# Patient Record
Sex: Female | Born: 1985 | Race: White | Hispanic: No | Smoking: Current every day smoker
Health system: Southern US, Community
[De-identification: ages and names within clinical notes are randomized; demographics above are authoritative.]

## PROBLEM LIST (undated history)

## (undated) DIAGNOSIS — D696 Thrombocytopenia, unspecified: Secondary | ICD-10-CM

## (undated) DIAGNOSIS — M549 Dorsalgia, unspecified: Secondary | ICD-10-CM

## (undated) DIAGNOSIS — M5126 Other intervertebral disc displacement, lumbar region: Secondary | ICD-10-CM

## (undated) HISTORY — DX: Thrombocytopenia, unspecified: D69.6

## (undated) HISTORY — DX: Other intervertebral disc displacement, lumbar region: M51.26

## (undated) HISTORY — PX: ANTERIOR CRUCIATE LIGAMENT REPAIR: SHX115

## (undated) HISTORY — PX: ABDOMINAL HYSTERECTOMY: SHX81

---
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
## 2005-03-07 ENCOUNTER — Emergency Department: Payer: Self-pay | Admitting: Emergency Medicine

## 2005-05-13 ENCOUNTER — Emergency Department: Payer: Self-pay | Admitting: Emergency Medicine

## 2005-05-14 ENCOUNTER — Emergency Department: Payer: Self-pay | Admitting: Internal Medicine

## 2005-05-22 ENCOUNTER — Emergency Department: Payer: Self-pay | Admitting: Emergency Medicine

## 2005-05-27 ENCOUNTER — Inpatient Hospital Stay: Payer: Self-pay | Admitting: Obstetrics and Gynecology

## 2005-09-30 ENCOUNTER — Emergency Department: Payer: Self-pay | Admitting: Emergency Medicine

## 2005-10-08 ENCOUNTER — Emergency Department: Payer: Self-pay | Admitting: General Practice

## 2005-10-09 ENCOUNTER — Inpatient Hospital Stay: Payer: Self-pay | Admitting: Unknown Physician Specialty

## 2005-12-02 ENCOUNTER — Emergency Department: Payer: Self-pay | Admitting: Emergency Medicine

## 2005-12-10 ENCOUNTER — Inpatient Hospital Stay: Payer: Self-pay | Admitting: Unknown Physician Specialty

## 2006-03-20 ENCOUNTER — Ambulatory Visit: Payer: Self-pay

## 2006-09-27 ENCOUNTER — Other Ambulatory Visit: Payer: Self-pay

## 2006-09-27 ENCOUNTER — Ambulatory Visit: Payer: Self-pay | Admitting: Emergency Medicine

## 2006-10-19 ENCOUNTER — Other Ambulatory Visit: Payer: Self-pay

## 2006-10-19 ENCOUNTER — Ambulatory Visit: Payer: Self-pay | Admitting: Emergency Medicine

## 2006-10-20 ENCOUNTER — Other Ambulatory Visit: Payer: Self-pay

## 2006-10-20 ENCOUNTER — Inpatient Hospital Stay: Payer: Self-pay | Admitting: Psychiatry

## 2006-11-02 ENCOUNTER — Other Ambulatory Visit: Payer: Self-pay

## 2006-11-02 ENCOUNTER — Emergency Department: Payer: Self-pay | Admitting: Unknown Physician Specialty

## 2006-12-31 ENCOUNTER — Ambulatory Visit: Payer: Self-pay | Admitting: Internal Medicine

## 2007-01-30 IMAGING — CR DG ANKLE 2V *L*
1 series · 2 of 2 positions shown · non-contrast
Comparison: none

REASON FOR EXAM: Pain, injury
COMMENTS:

[Series 1: view not recorded · 0.17mm/px · 2 of 2 slices shown]
[im 1/2]
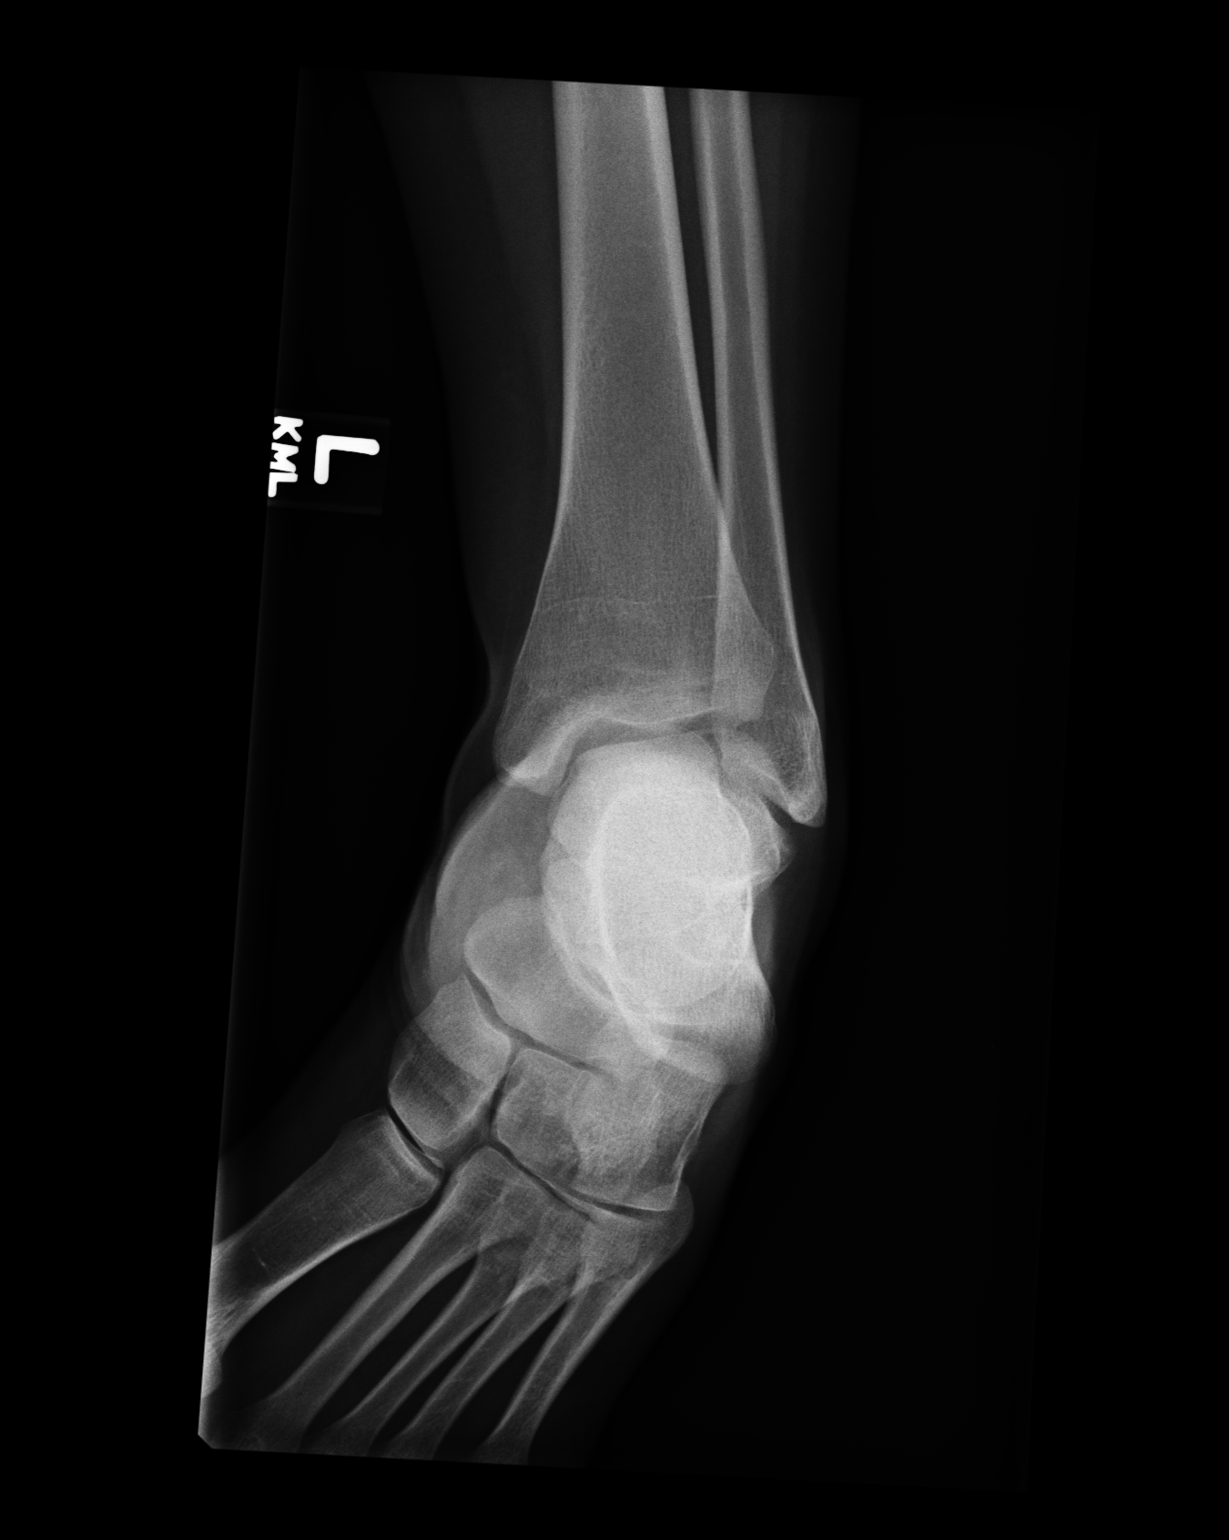
[im 2/2]
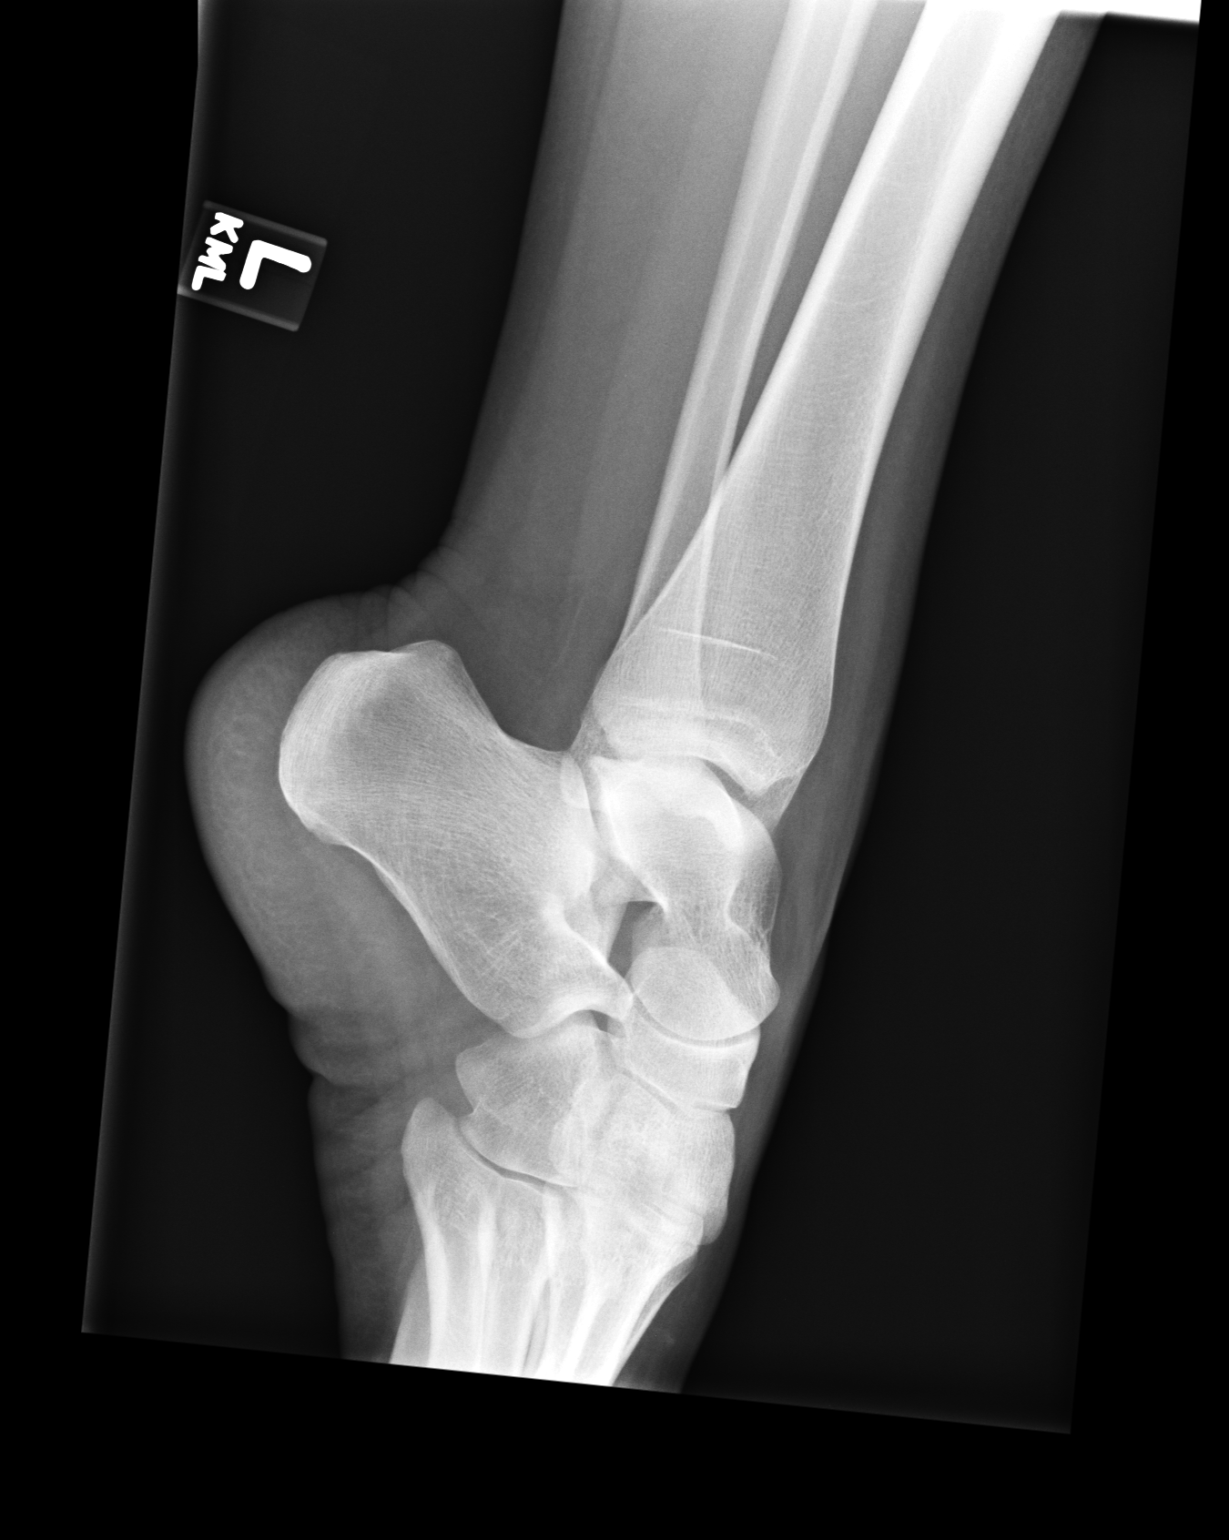

[2 of 2 positions shown; findings below may reference images not displayed]

PROCEDURE:     DXR - DXR ANKLE LEFT AP AND LATERAL  - December 02, 2004  [DATE]

RESULT:     The ankle is inverted and the toes turned in. The etiology for
this is to me uncertain. No internal derangement is identified to account
for this. Repeat CT with the cast off might be helpful for further
evaluation, if such is clinically indicated. No fracture about the ankle is
identified.
IMPRESSION: Please see above.

## 2007-01-30 IMAGING — CR DG FOOT 2V*L*
1 series · 2 of 2 positions shown · non-contrast
Comparison: none

REASON FOR EXAM: pain in foot
COMMENTS:

PROCEDURE:     DXR - DXR FOOT LEFT AP AND LATERAL  - December 02, 2004  [DATE]
RESULT:     Two views of the LEFT foot show no fracture, dislocation or
other acute bony abnormality.

[Series 1: view not recorded · 0.17mm/px · 2 of 2 slices shown]
[im 1/2]
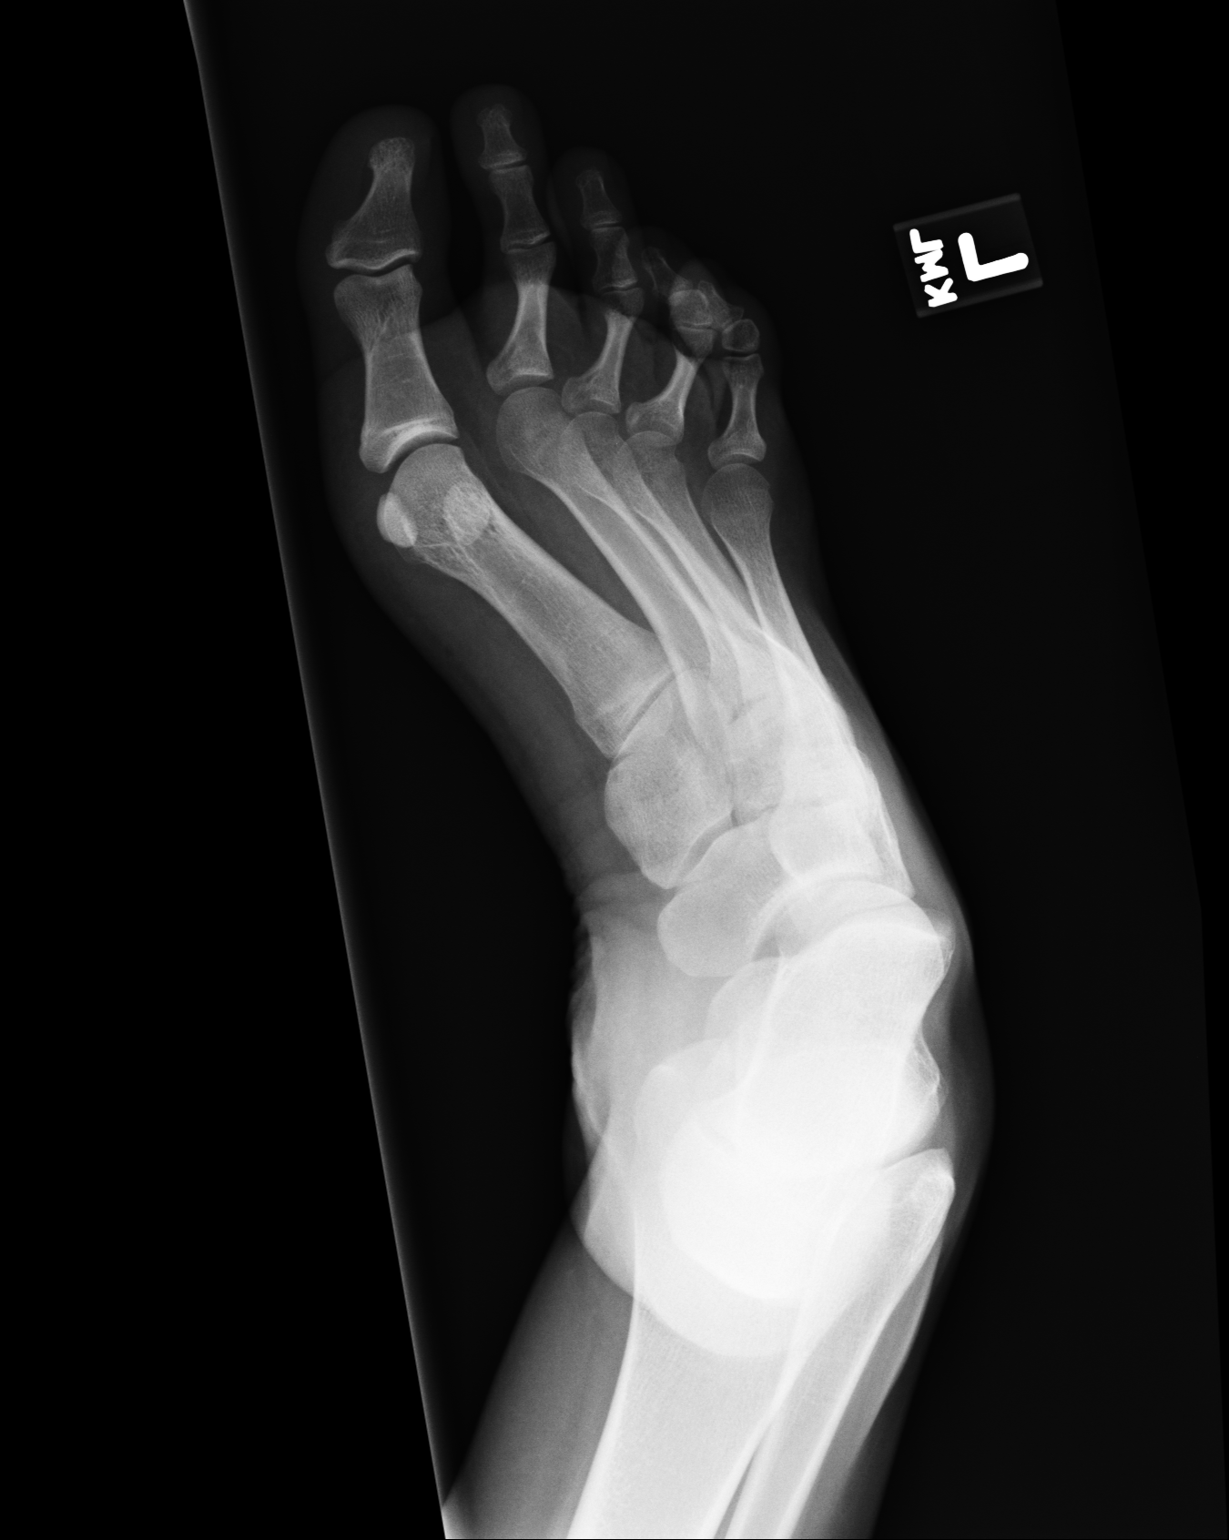
[im 2/2]
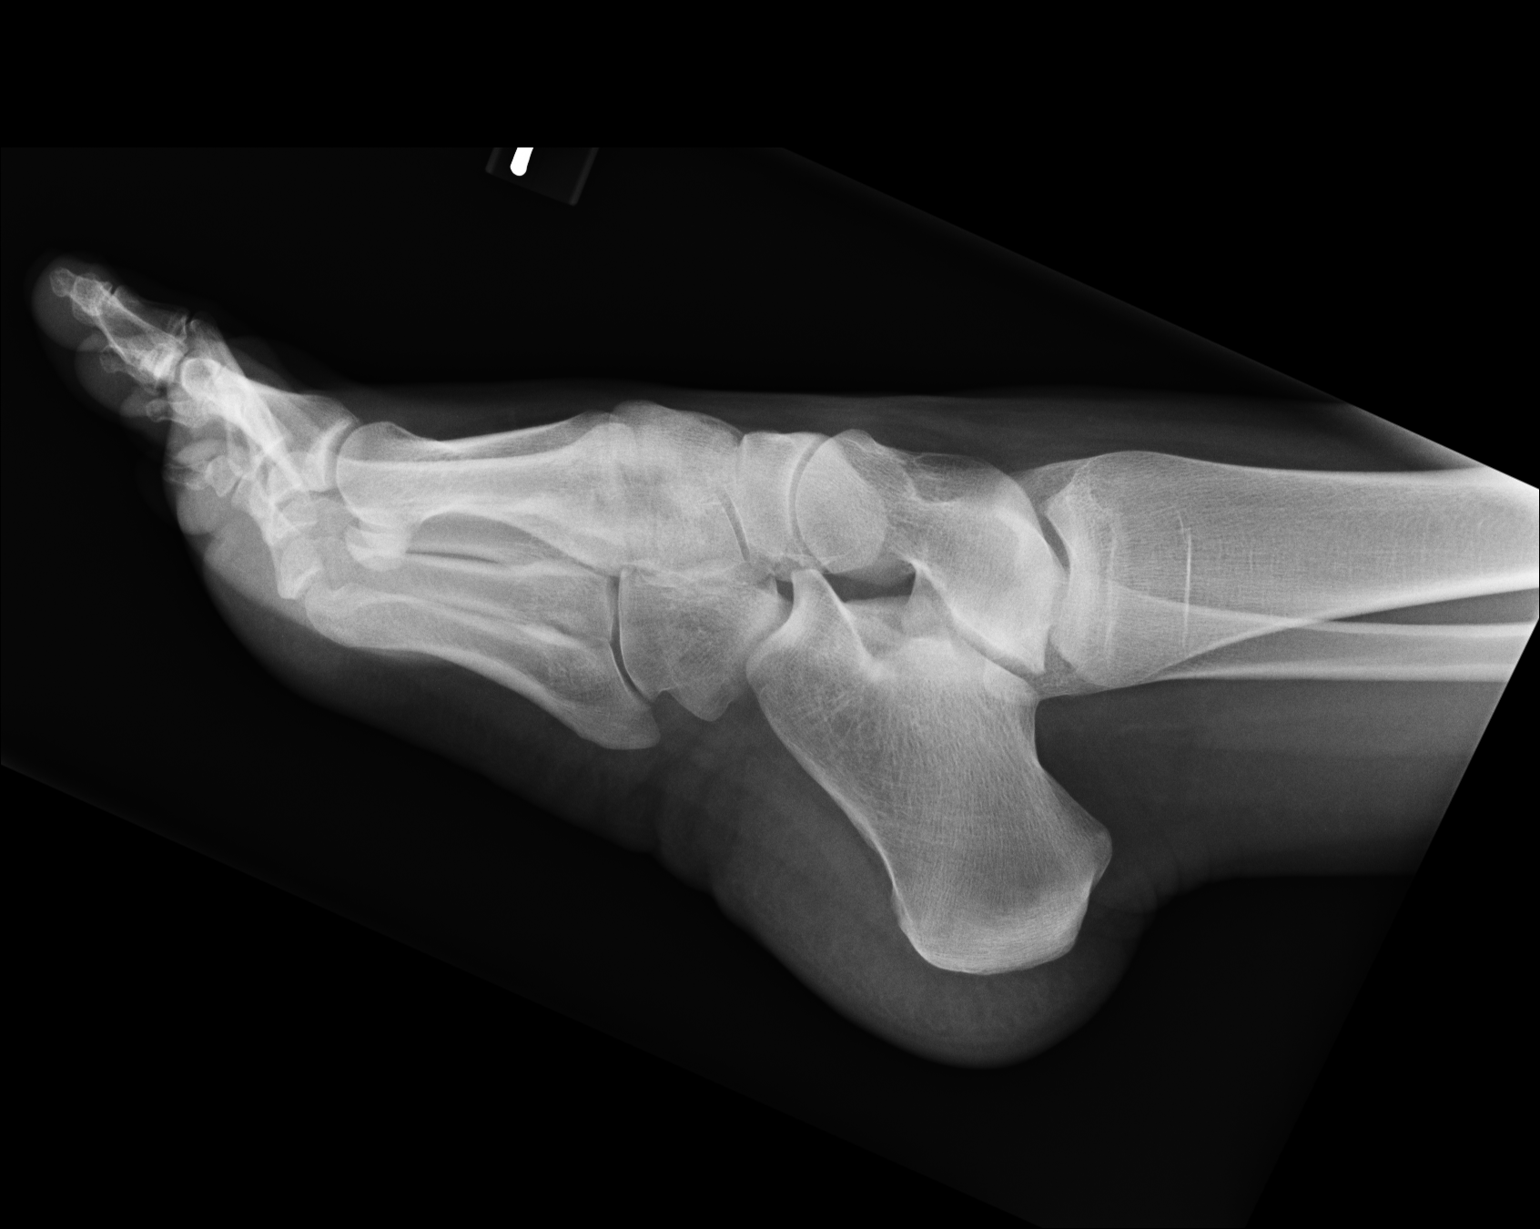

[2 of 2 positions shown; findings below may reference images not displayed]

IMPRESSION: 1)No acute bony abnormalities are identified.

## 2007-02-26 IMAGING — US US OB < 14 WEEKS - US OB TV
1 series · 17 of 28 positions shown · non-contrast
Comparison: none

REASON FOR EXAM: Pregnant w/vaginal bleeding
COMMENTS:

[Series 1: us ob < 14 weeks - us ob tv · 17 of 53 slices shown]
[im 1/53]
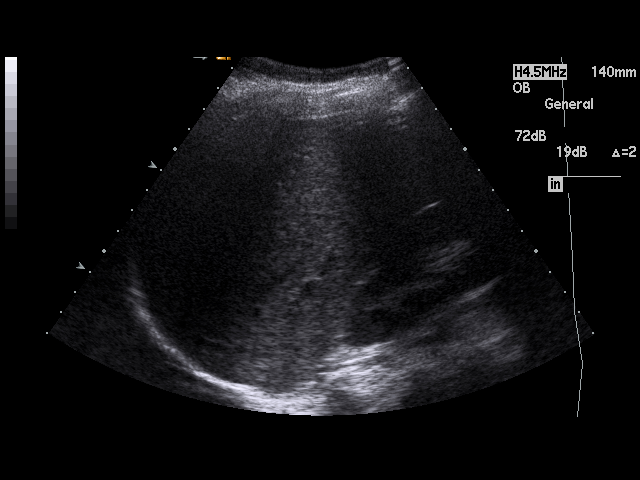
[im 4/53]
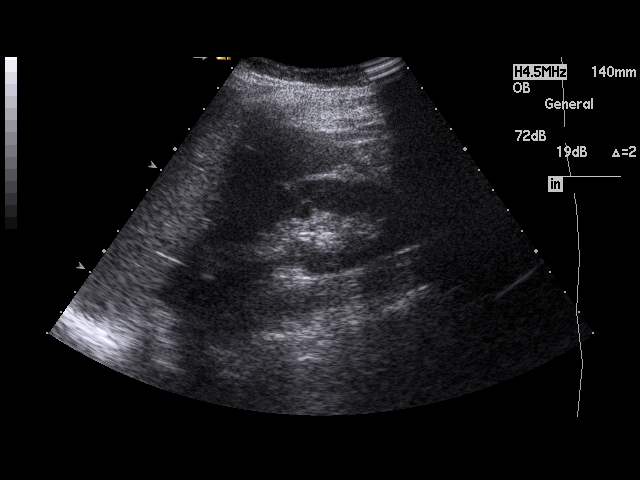
[im 8/53]
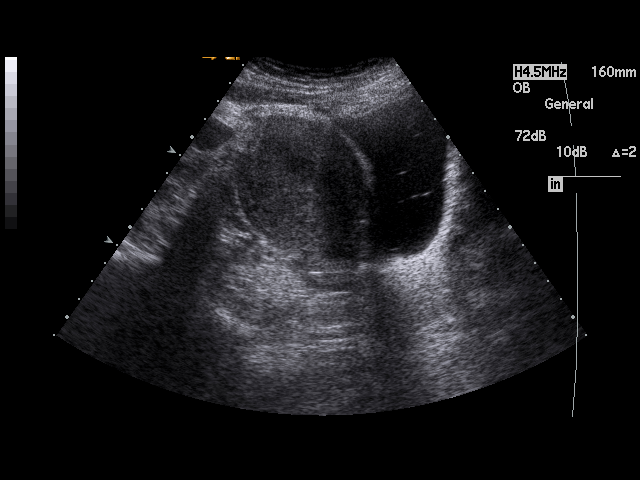
[im 10/53]
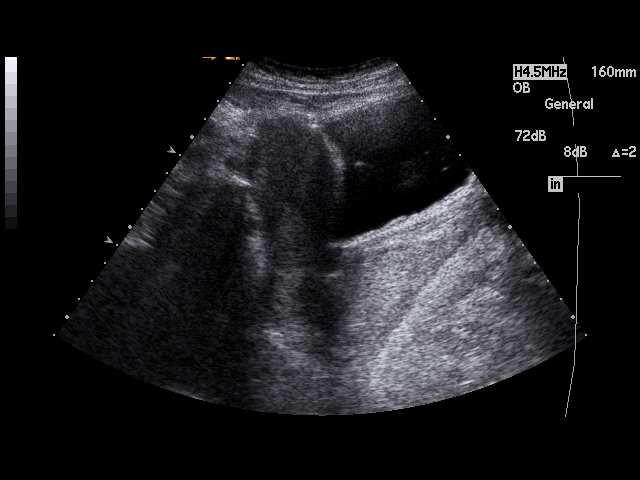
[im 14/53]
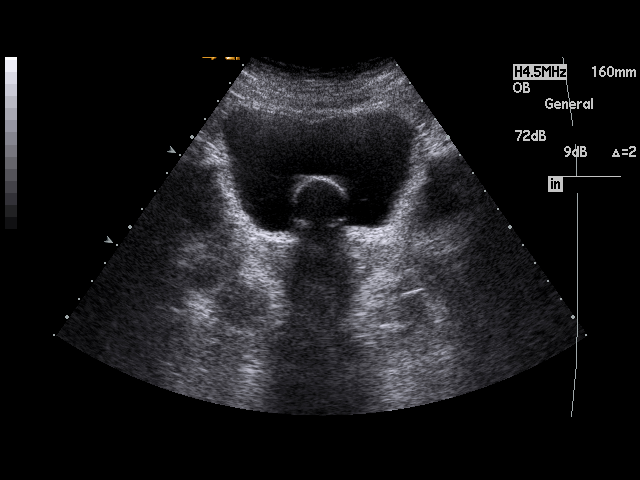
[im 18/53]
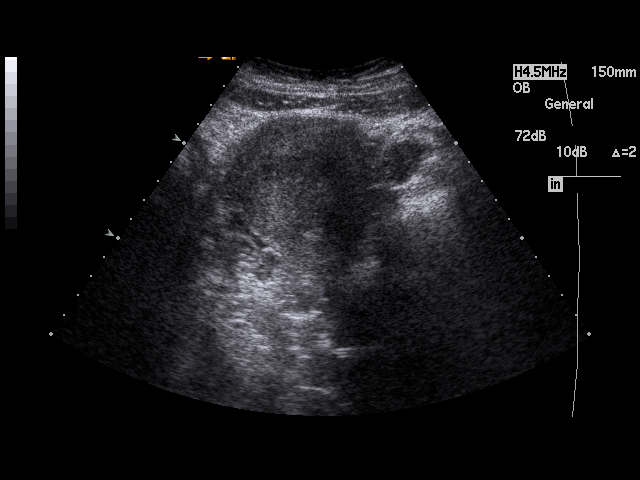
[im 20/53]
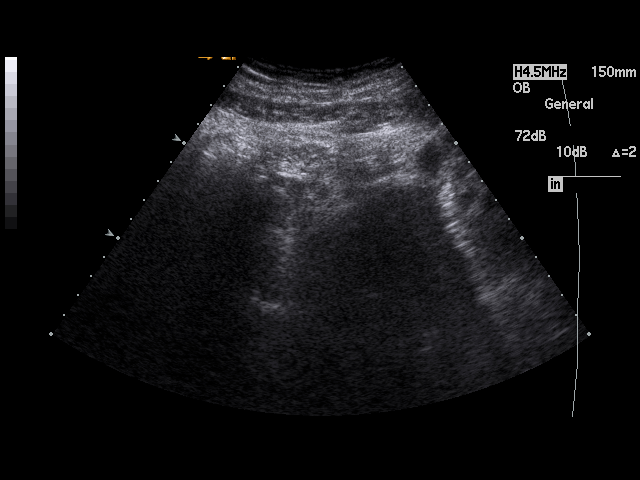
[im 24/53]
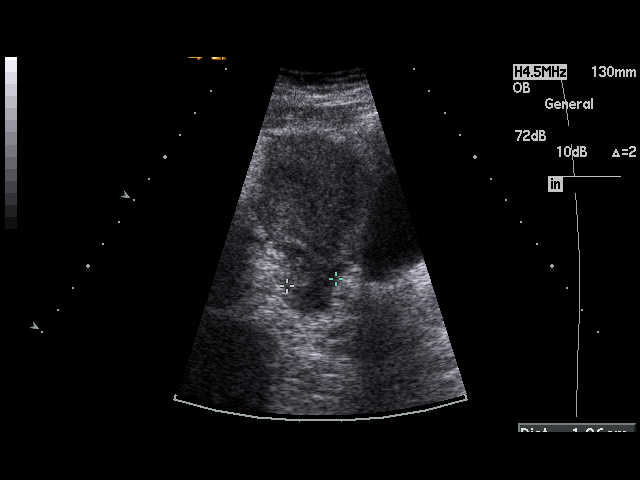
[im 27/53]
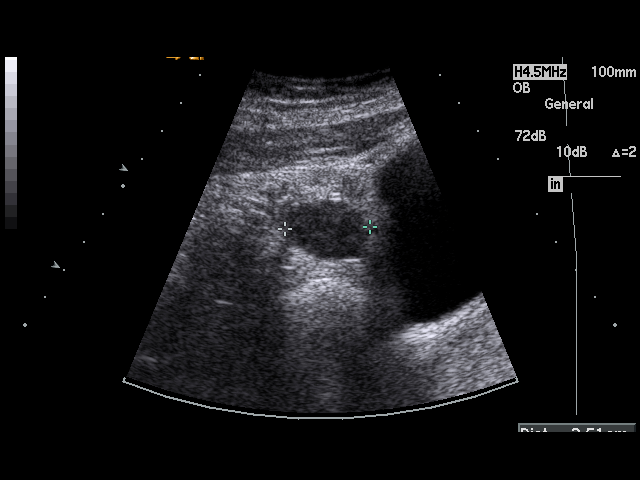
[im 29/53]
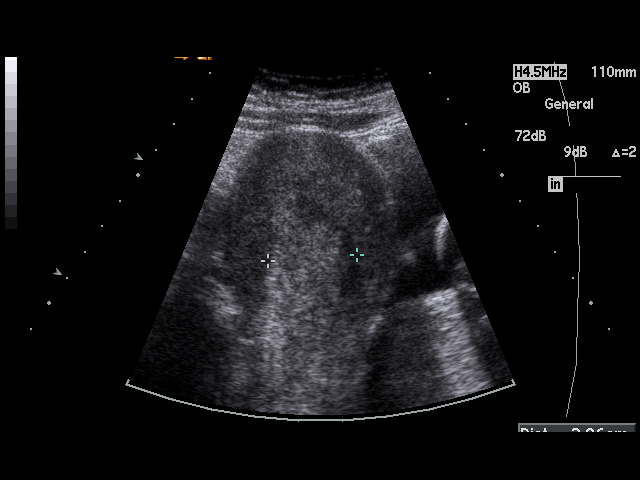
[im 33/53]
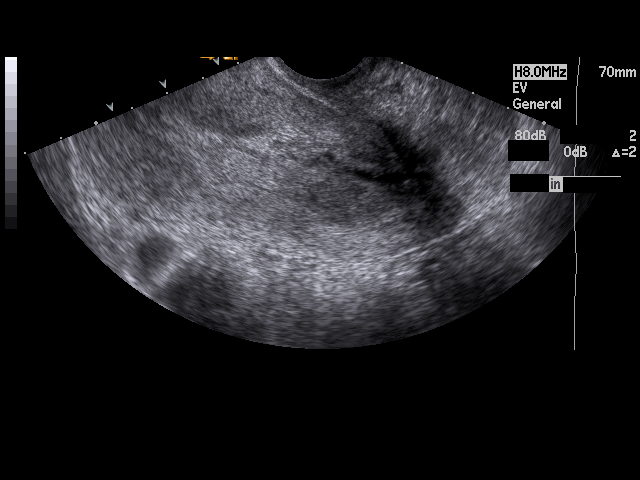
[im 35/53]
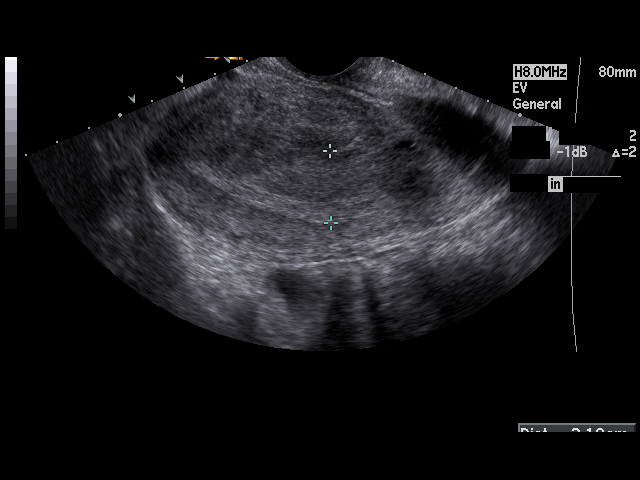
[im 39/53]
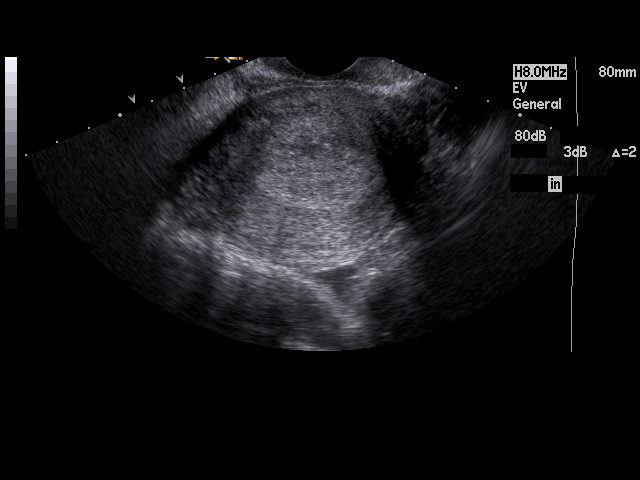
[im 43/53]
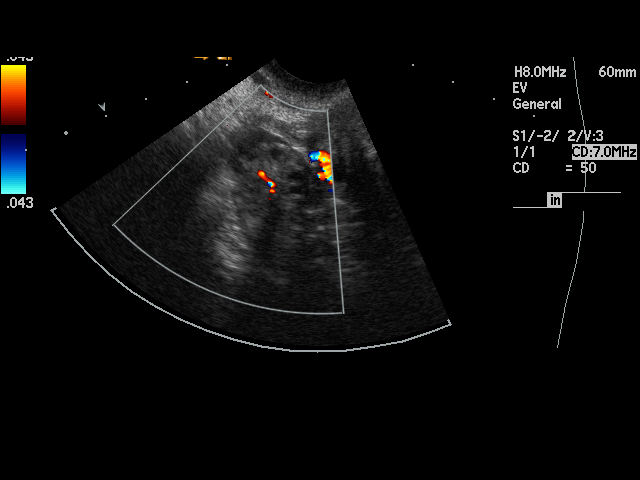
[im 45/53]
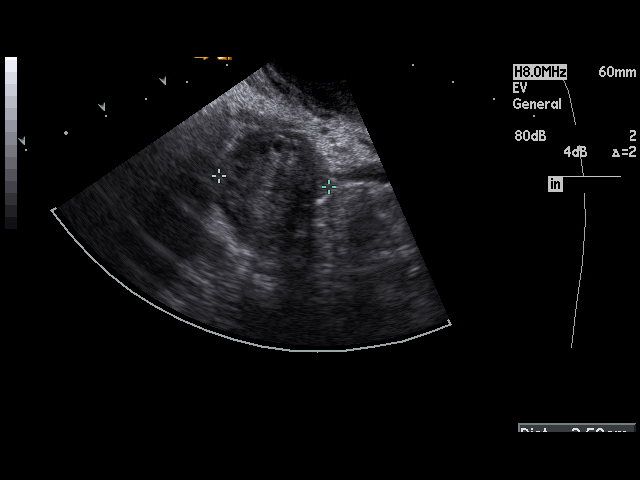
[im 49/53]
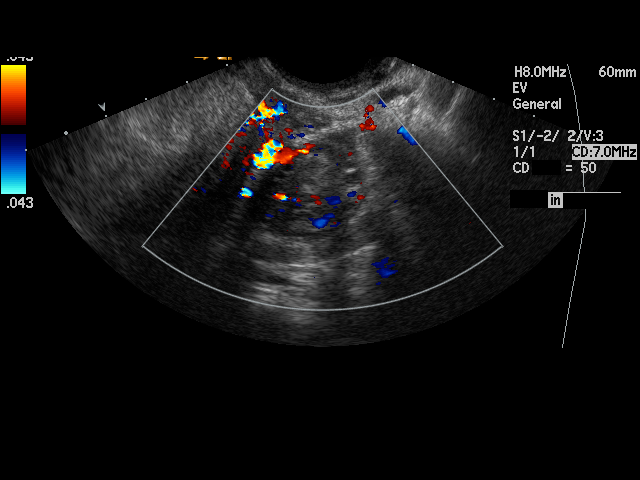
[im 53/53]
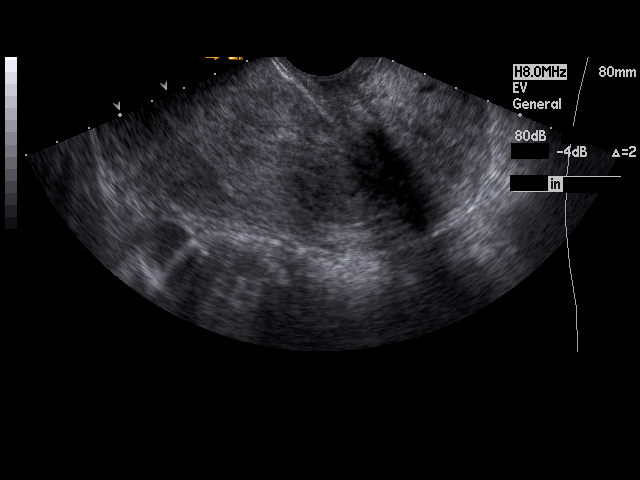

[17 of 28 positions shown; findings below may reference images not displayed]

PROCEDURE:     US  - US OB LESS THAN 14 WEEKS  - December 29, 2004  [DATE]

RESULT:     Real-time imaging was obtained.  The uterus is slightly
anteflexed.  There is thickening of the endometrial lining which may be
secondary to blood from the recent bleeding.  No intrauterine gestational
sac is identified.  No definite adnexal mass is seen, and no fluid is noted
in the cul-de-sac region.  No hydronephrosis is identified.  The ovaries
appear within normal limits.
IMPRESSION: No intrauterine gestational sac.  The findings may be
compatible with a missed abortion.  I cannot totally exclude an ectopic, and
this can be correlated clinically with followup beta HCGs and possibly a
followup ultrasound.

## 2007-03-25 ENCOUNTER — Emergency Department: Payer: Self-pay | Admitting: Emergency Medicine

## 2007-03-25 ENCOUNTER — Other Ambulatory Visit: Payer: Self-pay

## 2007-04-28 ENCOUNTER — Other Ambulatory Visit: Payer: Self-pay

## 2007-04-28 ENCOUNTER — Emergency Department: Payer: Self-pay | Admitting: Emergency Medicine

## 2007-06-14 ENCOUNTER — Emergency Department: Payer: Self-pay | Admitting: Emergency Medicine

## 2007-06-14 ENCOUNTER — Other Ambulatory Visit: Payer: Self-pay

## 2007-07-22 ENCOUNTER — Emergency Department: Payer: Self-pay | Admitting: Emergency Medicine

## 2007-08-23 ENCOUNTER — Emergency Department: Payer: Self-pay | Admitting: Emergency Medicine

## 2008-05-29 ENCOUNTER — Emergency Department: Payer: Self-pay | Admitting: Emergency Medicine

## 2008-06-04 ENCOUNTER — Emergency Department: Payer: Self-pay

## 2008-10-27 ENCOUNTER — Emergency Department: Payer: Self-pay | Admitting: Emergency Medicine

## 2008-11-24 ENCOUNTER — Ambulatory Visit: Payer: Self-pay | Admitting: Internal Medicine

## 2008-11-24 IMAGING — CR DG CHEST 2V
1 series · 2 of 2 positions shown · non-contrast
Comparison: none

REASON FOR EXAM: CP - RM 9
COMMENTS:

PROCEDURE:     MDR - MDR CHEST PA(OR AP) AND LATERAL  - September 27, 2006  [DATE]
RESULT:     The lungs are adequately inflated. There is no focal infiltrate.
The heart and pulmonary vascularity are within the limits of normal. There
is no evidence of a pleural effusion.

[Series 1: view not recorded · 0.17mm/px · 2 of 2 slices shown]
[im 1/2]
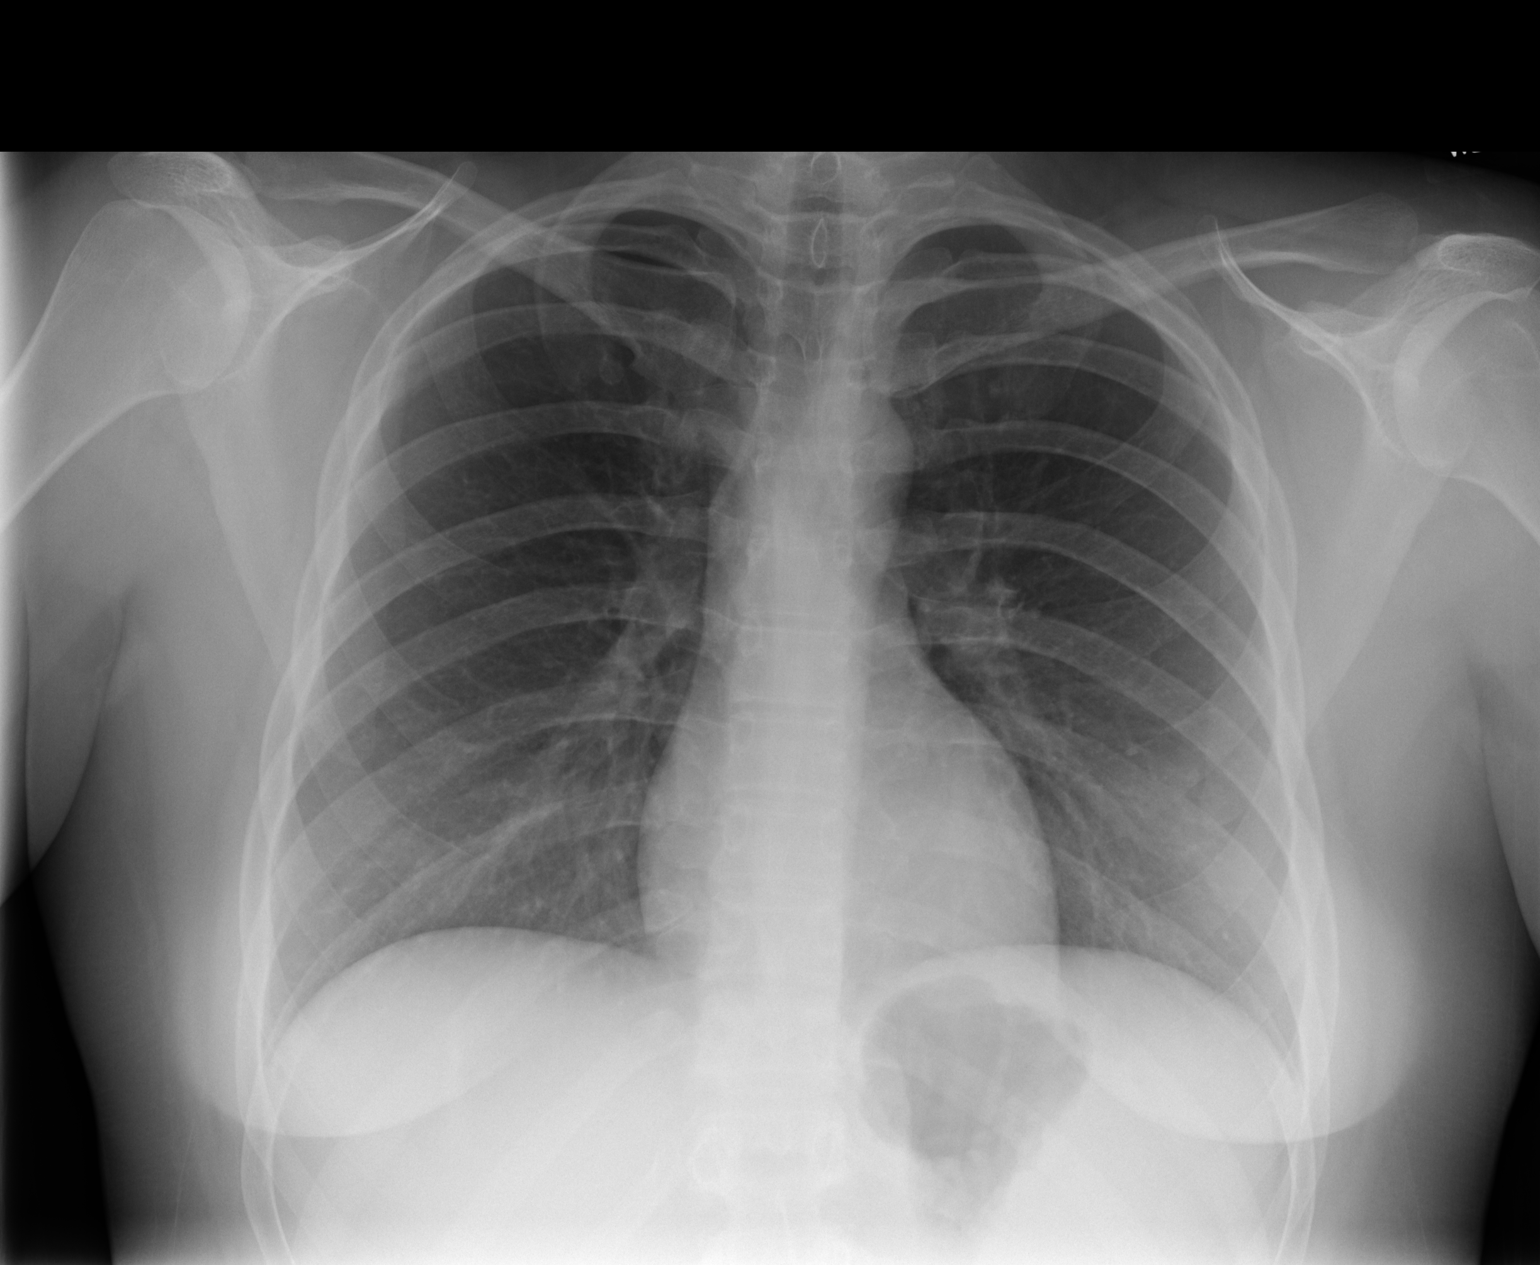
[im 2/2]
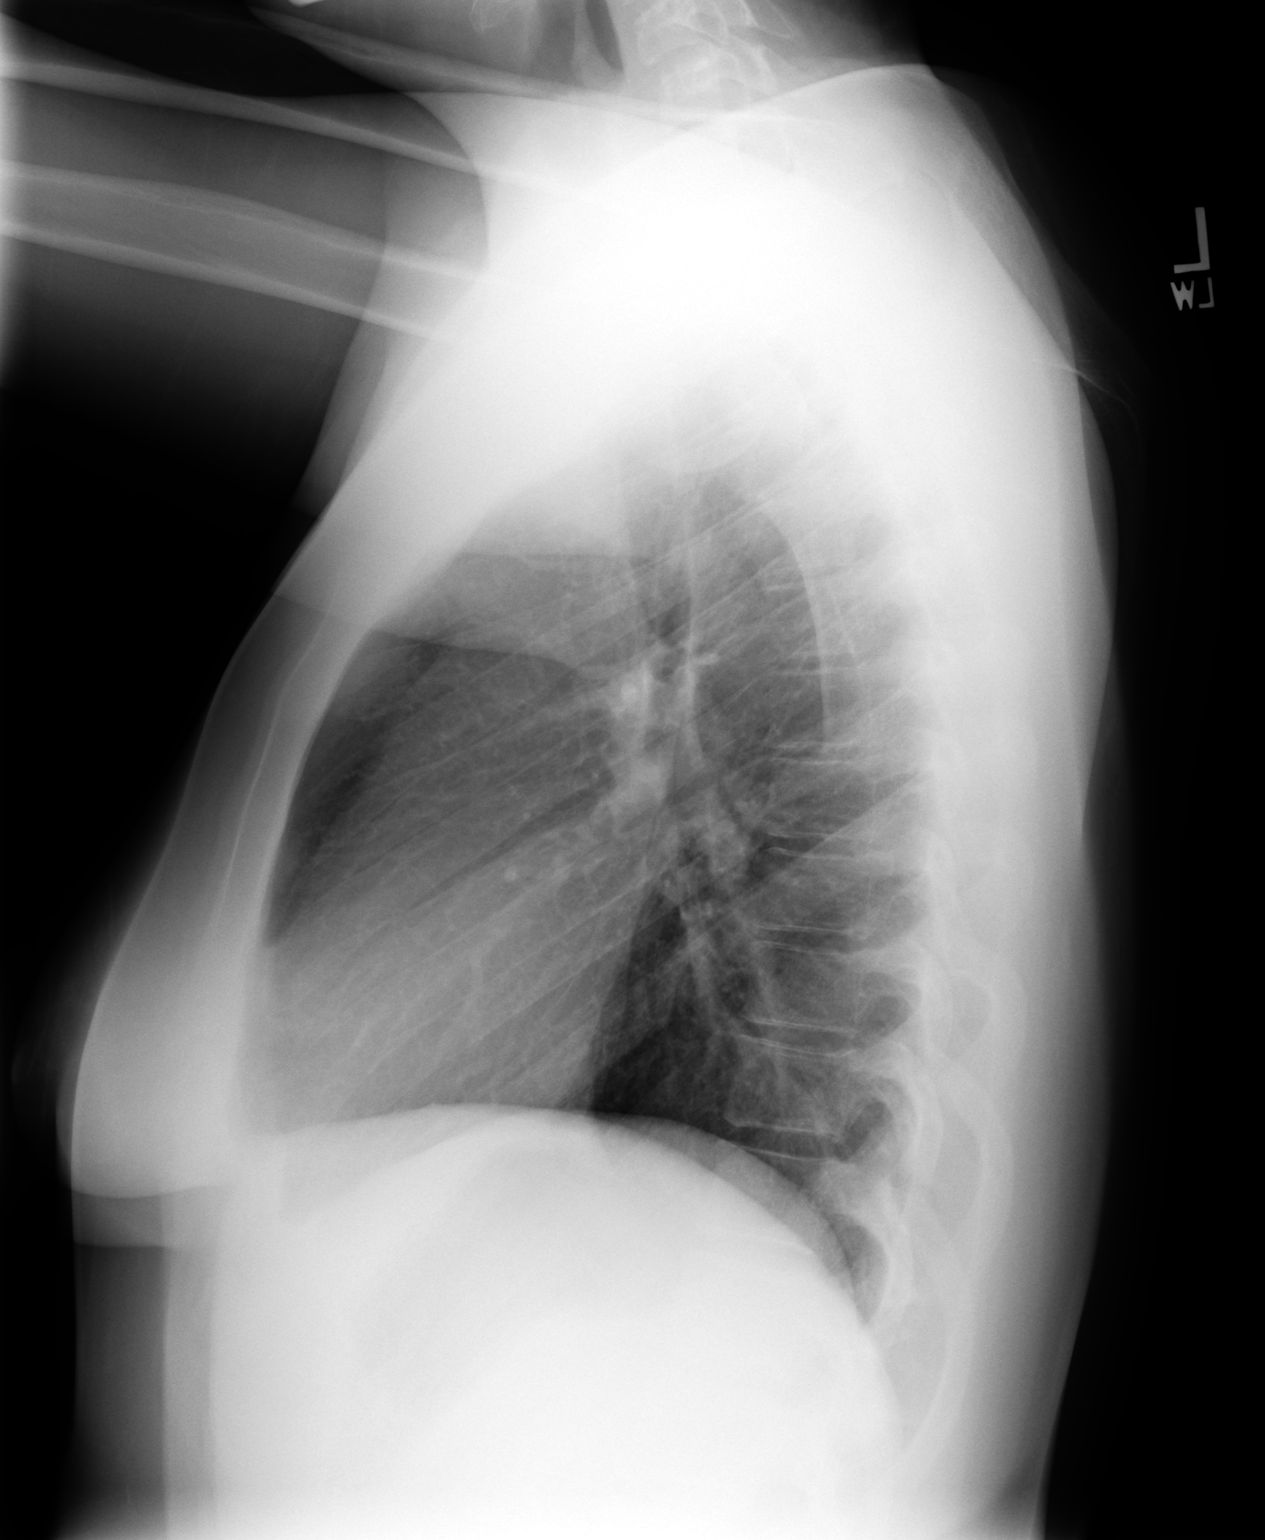

[2 of 2 positions shown; findings below may reference images not displayed]

IMPRESSION: 1.     I do not see evidence of acute cardiopulmonary abnormality.

## 2008-11-26 ENCOUNTER — Emergency Department: Payer: Self-pay | Admitting: Emergency Medicine

## 2008-12-16 IMAGING — CR DG CHEST 2V
1 series · 2 of 2 positions shown · non-contrast
Comparison: none

REASON FOR EXAM: Shortness of breath, chest pain
COMMENTS:

PROCEDURE:     MDR - MDR CHEST PA(OR AP) AND LATERAL  - October 19, 2006  [DATE]
RESULT:     Comparison is made to a prior study dated 09/27/2006.
The lungs are clear. The cardiac silhouette and visualized bony skeleton are
unremarkable.

[Series 1: view not recorded · 0.17mm/px · 2 of 2 slices shown]
[im 1/2]
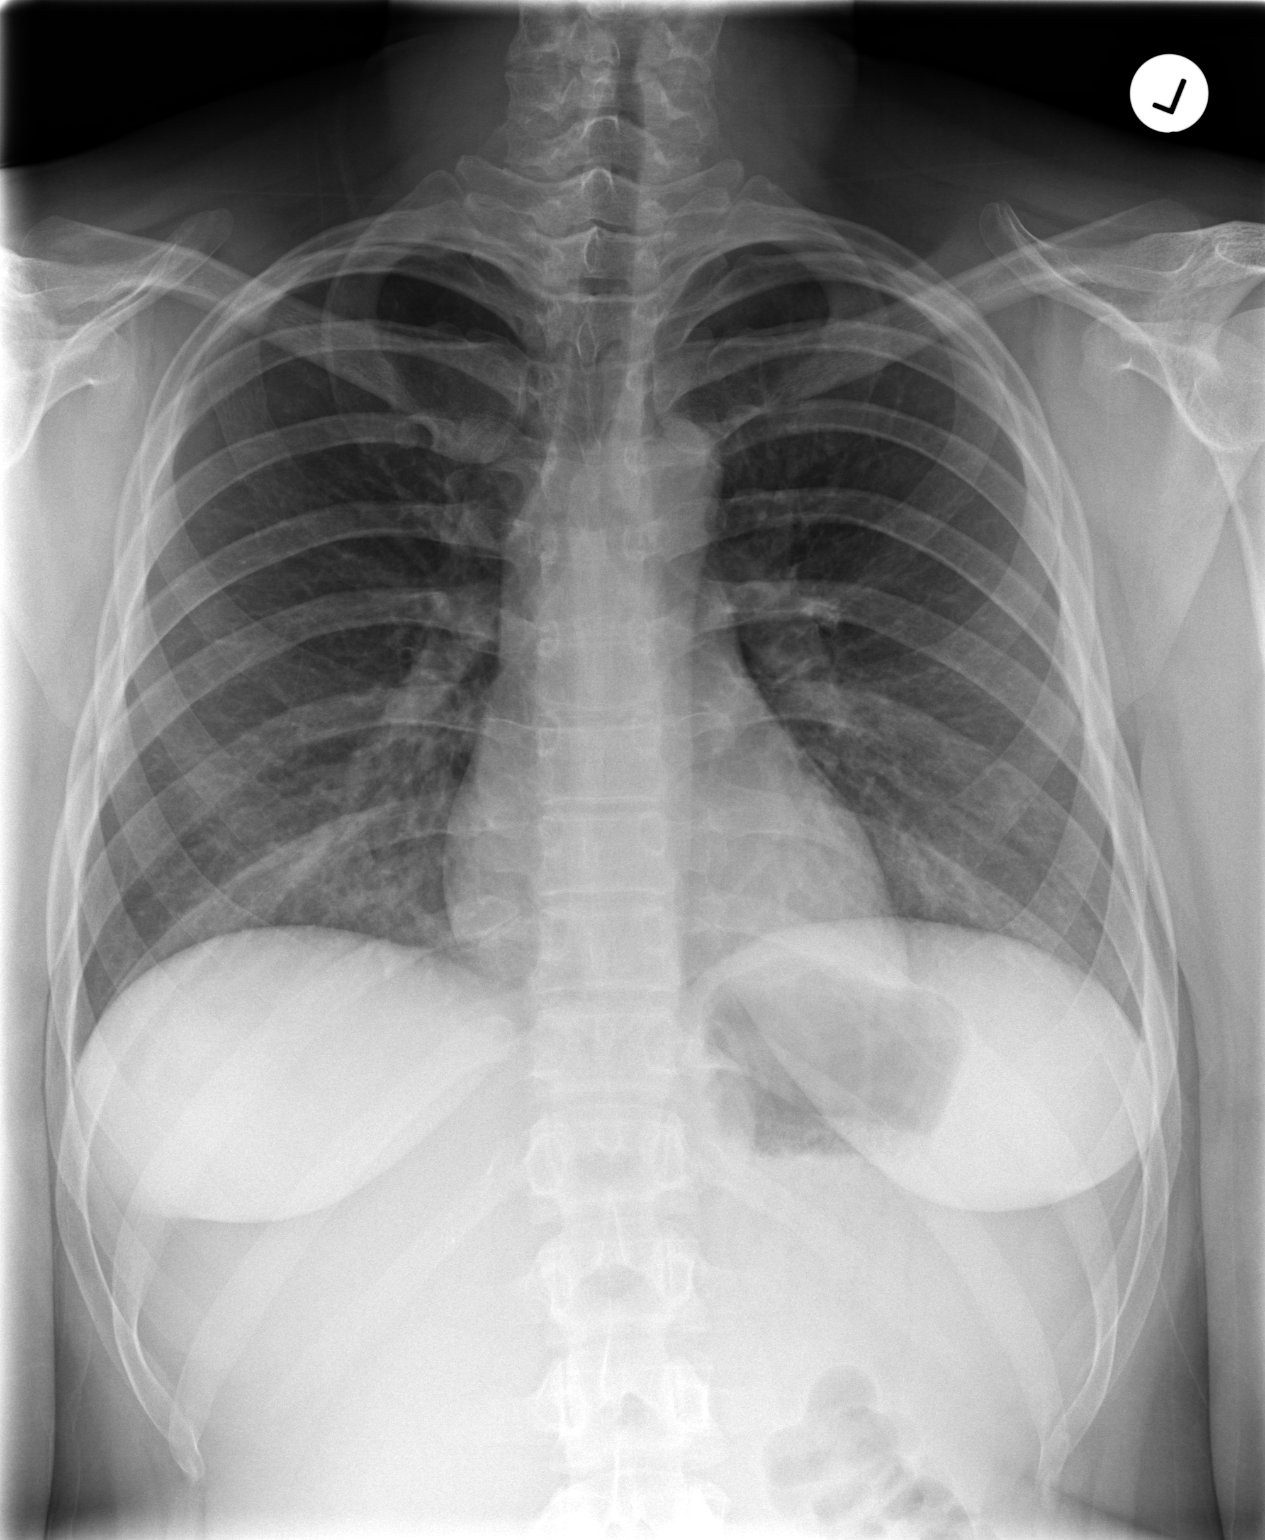
[im 2/2]
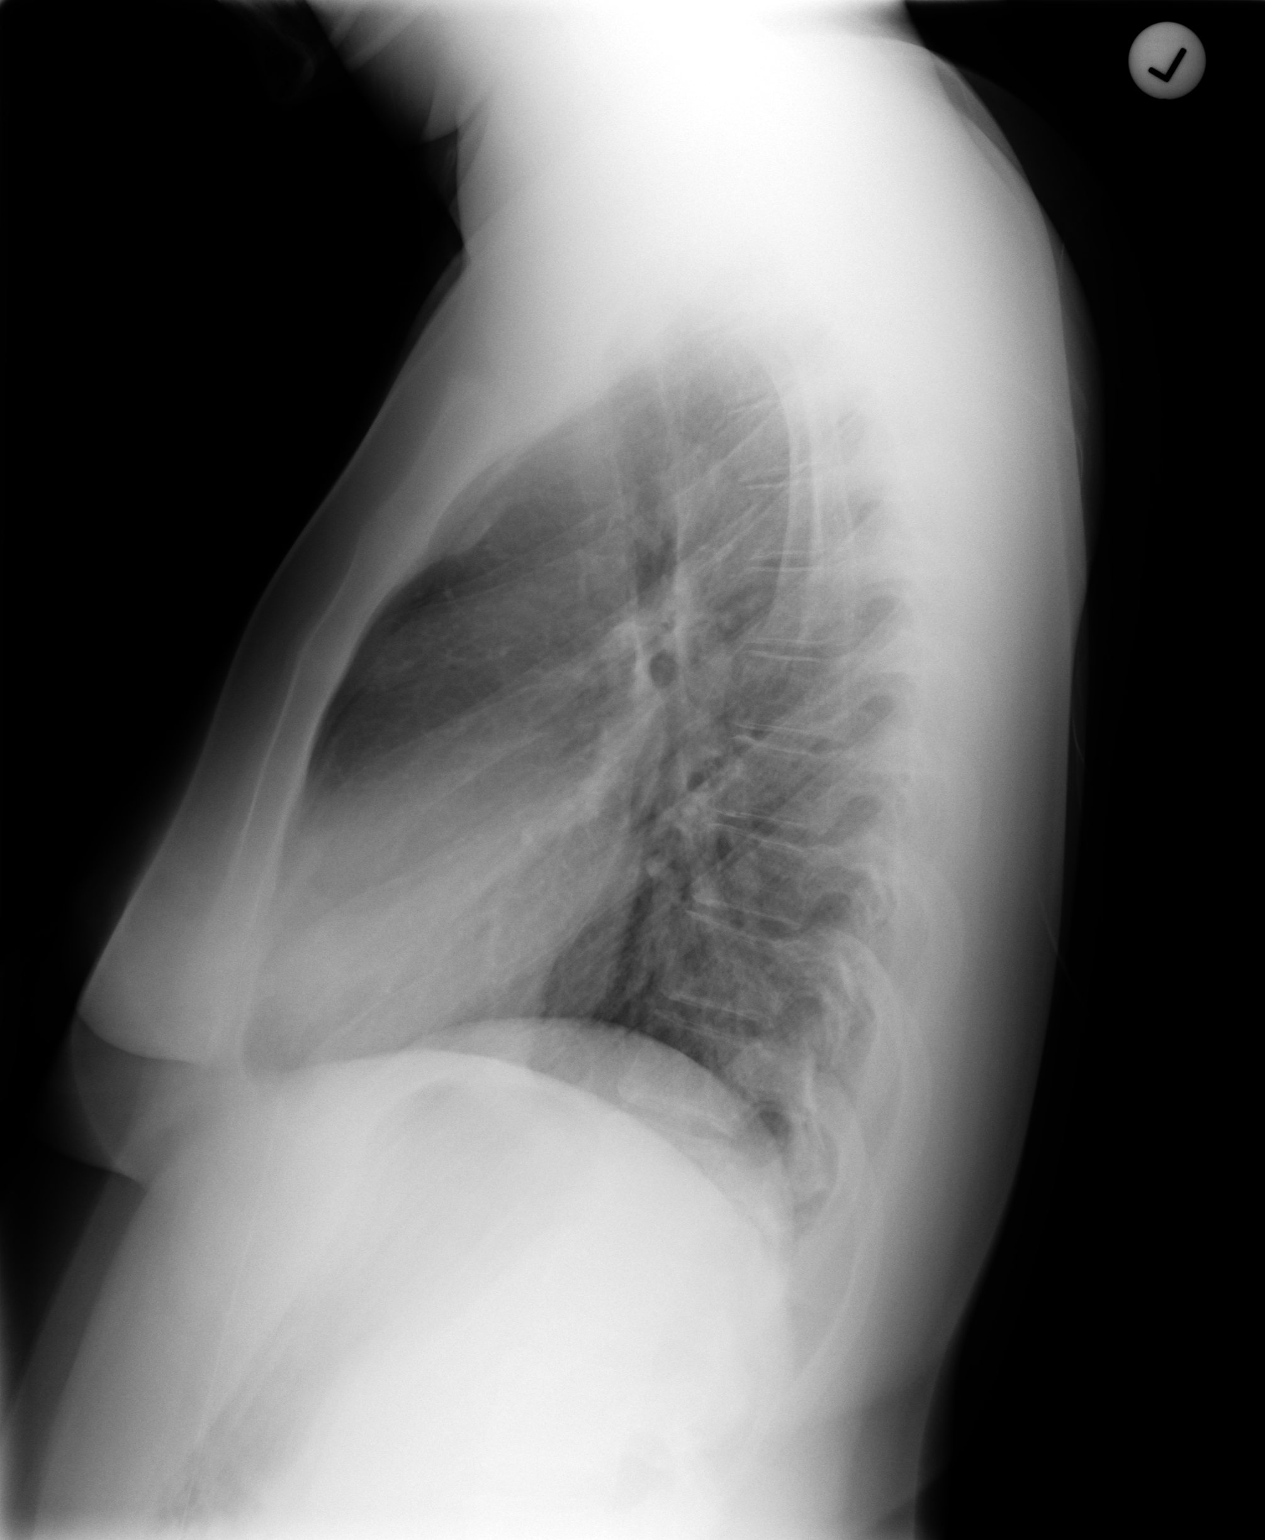

[2 of 2 positions shown; findings below may reference images not displayed]

IMPRESSION: Chest radiograph without evidence of acute cardiopulmonary
disease.

## 2009-09-17 ENCOUNTER — Emergency Department: Payer: Self-pay | Admitting: Emergency Medicine

## 2012-04-14 ENCOUNTER — Emergency Department: Payer: Self-pay | Admitting: Internal Medicine

## 2012-07-13 ENCOUNTER — Emergency Department: Payer: Self-pay | Admitting: Emergency Medicine

## 2012-10-30 ENCOUNTER — Emergency Department: Payer: Self-pay | Admitting: Emergency Medicine

## 2012-10-30 LAB — CBC WITH DIFFERENTIAL/PLATELET
Basophil #: 0 10*3/uL (ref 0.0–0.1)
Eosinophil #: 0.1 10*3/uL (ref 0.0–0.7)
HGB: 15.4 g/dL (ref 12.0–16.0)
Lymphocyte %: 19.4 %
MCH: 31.6 pg (ref 26.0–34.0)
MCHC: 36.2 g/dL — ABNORMAL HIGH (ref 32.0–36.0)
Monocyte #: 0.3 x10 3/mm (ref 0.2–0.9)
Neutrophil #: 5.8 10*3/uL (ref 1.4–6.5)
Platelet: 153 10*3/uL (ref 150–440)

## 2012-10-30 LAB — COMPREHENSIVE METABOLIC PANEL
Albumin: 4.4 g/dL (ref 3.4–5.0)
Anion Gap: 6 — ABNORMAL LOW (ref 7–16)
BUN: 9 mg/dL (ref 7–18)
Bilirubin,Total: 0.5 mg/dL (ref 0.2–1.0)
Calcium, Total: 9.6 mg/dL (ref 8.5–10.1)
Chloride: 108 mmol/L — ABNORMAL HIGH (ref 98–107)
Co2: 25 mmol/L (ref 21–32)
Creatinine: 0.57 mg/dL — ABNORMAL LOW (ref 0.60–1.30)
EGFR (Non-African Amer.): >60
Glucose: 117 mg/dL — ABNORMAL HIGH (ref 65–99)
Osmolality: 277 (ref 275–301)
SGPT (ALT): 40 U/L (ref 12–78)
Total Protein: 7.8 g/dL (ref 6.4–8.2)

## 2012-10-30 LAB — URINALYSIS, COMPLETE
Bilirubin,UR: NEGATIVE
Blood: NEGATIVE
Ketone: NEGATIVE
Leukocyte Esterase: NEGATIVE
Ph: 9 (ref 4.5–8.0)
Protein: NEGATIVE
RBC,UR: 1 /HPF (ref 0–5)
Specific Gravity: 1.012 (ref 1.003–1.030)
WBC UR: <1 /HPF (ref 0–5)

## 2013-04-04 ENCOUNTER — Emergency Department: Payer: Self-pay | Admitting: Emergency Medicine

## 2013-08-17 ENCOUNTER — Emergency Department: Payer: Self-pay | Admitting: Emergency Medicine

## 2017-03-24 ENCOUNTER — Encounter: Payer: Self-pay | Admitting: *Deleted

## 2017-03-24 ENCOUNTER — Other Ambulatory Visit: Payer: Self-pay

## 2017-03-24 ENCOUNTER — Ambulatory Visit
Admission: EM | Admit: 2017-03-24 | Discharge: 2017-03-24 | Disposition: A | Discharge status: Home / Self Care | Payer: Medicaid Other | Attending: Family Medicine | Admitting: Family Medicine

## 2017-03-24 DIAGNOSIS — J028 Acute pharyngitis due to other specified organisms: Secondary | ICD-10-CM

## 2017-03-24 DIAGNOSIS — F17210 Nicotine dependence, cigarettes, uncomplicated: Secondary | ICD-10-CM | POA: Insufficient documentation

## 2017-03-24 DIAGNOSIS — J040 Acute laryngitis: Secondary | ICD-10-CM | POA: Diagnosis not present

## 2017-03-24 DIAGNOSIS — R059 Cough, unspecified: Secondary | ICD-10-CM

## 2017-03-24 DIAGNOSIS — Z880 Allergy status to penicillin: Secondary | ICD-10-CM | POA: Diagnosis not present

## 2017-03-24 DIAGNOSIS — Z79899 Other long term (current) drug therapy: Secondary | ICD-10-CM | POA: Insufficient documentation

## 2017-03-24 DIAGNOSIS — R05 Cough: Secondary | ICD-10-CM | POA: Diagnosis not present

## 2017-03-24 DIAGNOSIS — J029 Acute pharyngitis, unspecified: Secondary | ICD-10-CM | POA: Insufficient documentation

## 2017-03-24 DIAGNOSIS — R509 Fever, unspecified: Secondary | ICD-10-CM | POA: Diagnosis present

## 2017-03-24 HISTORY — DX: Dorsalgia, unspecified: M54.9

## 2017-03-24 LAB — RAPID STREP SCREEN (MED CTR MEBANE ONLY): STREPTOCOCCUS, GROUP A SCREEN (DIRECT): NEGATIVE

## 2017-03-24 MED ORDER — BENZONATATE 100 MG PO CAPS: 100.0000 mg | ORAL_CAPSULE | Freq: Three times a day (TID) | ORAL | 0 refills | Status: DC | PRN

## 2017-03-24 MED ORDER — PREDNISONE 20 MG PO TABS: 20.0000 mg | ORAL_TABLET | Freq: Every day | ORAL | 0 refills | Status: DC

## 2017-03-24 NOTE — ED Provider Notes (Signed)
[facility]    CSN: [id number] Arrival date & time: 03/24/17  1146     History   Chief Complaint Chief Complaint  Patient presents with  . Sore Throat  . Cough  . Fever    HPI Tara Nelson is a 32 y.o. female presented to clinic with CC of cough, sore throat x 4 days. Cough non productive. Lost voice today. Denies fever/chills.  The history is provided by the patient.  Sore Throat  This is a new problem. The current episode started more than 2 days ago. The problem occurs constantly. The problem has been rapidly worsening. She has tried nothing for the symptoms.  Cough  Associated symptoms: fever   Fever  Associated symptoms: cough     Past Medical History:  Diagnosis Date  . Back pain     There are no active problems to display for this patient.   Past Surgical History:  Procedure Laterality Date  . ABDOMINAL HYSTERECTOMY      OB History    No data available       Home Medications    Prior to Admission medications   Medication Sig Start Date End Date Taking? Authorizing Provider  cyclobenzaprine (FLEXERIL) 10 MG tablet Take 10 mg by mouth 2 (two) times daily.   Yes [provider]  gabapentin (NEURONTIN) 100 MG capsule Take 100 mg by mouth daily.   Yes [provider]  oxycodone (OXY-IR) 5 MG capsule Take 5 mg by mouth 2 (two) times daily.   Yes [provider]  benzonatate (TESSALON) 100 MG capsule Take 1 capsule (100 mg total) by mouth 3 (three) times daily as needed for cough. 03/24/17   Ashaunte Standley, NP  predniSONE (DELTASONE) 20 MG tablet Take 1 tablet (20 mg total) by mouth daily with breakfast. 03/24/17   Sterlin Knightly, NP    Family History History reviewed. No pertinent family history.  Social History Social History   Tobacco Use  . Smoking status: Current Every Day Smoker    Packs/day: 0.50    Types: Cigarettes  . Smokeless tobacco: Never Used  Substance Use Topics  . Alcohol use: No   Frequency: Never  . Drug use: No     Allergies   Penicillins; Sulfa antibiotics; and Toradol [ketorolac tromethamine]   Review of Systems Review of Systems  Constitutional: Positive for fever.  HENT: Positive for postnasal drip.   Eyes: Negative.   Respiratory: Positive for cough.   Cardiovascular: Negative.      Physical Exam Triage Vital Signs ED Triage Vitals  Enc Vitals Group     BP 03/24/17 1204 108/64     Pulse Rate 03/24/17 1204 (!) 103     Resp 03/24/17 1204 18     Temp 03/24/17 1204 98.8 F (37.1 C)     Temp Source 03/24/17 1204 Oral     SpO2 03/24/17 1204 97 %     Weight 03/24/17 1205 220 lb (99.8 kg)     Height 03/24/17 1205 5\' 9"  (1.753 m)     Head Circumference --      Peak Flow --      Pain Score 03/24/17 1206 6     Pain Loc --      Pain Edu? --      Excl. in GC? --    No data found.  Updated Vital Signs BP 108/64 (BP Location: Left Arm)   Pulse (!) 103   Temp 98.8 F (37.1 C) (Oral)  Resp 18   Ht 5\' 9"  (1.753 m)   Wt 220 lb (99.8 kg)   SpO2 97%   BMI 32.49 kg/m   Visual Acuity Right Eye Distance:   Left Eye Distance:   Bilateral Distance:    Right Eye Near:   Left Eye Near:    Bilateral Near:     Physical Exam  Constitutional: She appears well-developed.  HENT:  Head: Normocephalic.  Mouth/Throat: Mucous membranes are normal. Posterior oropharyngeal erythema (Mild pharyngeal erythema. Painful to swallow ) present.  Cardiovascular: Regular rhythm and normal heart sounds.  Pulmonary/Chest: Effort normal and breath sounds normal. No respiratory distress. She has no wheezes.  Dry, non productive cough   Skin: Skin is warm.     UC Treatments / Results  Labs (all labs ordered are listed, but only abnormal results are displayed) Labs Reviewed  RAPID STREP SCREEN (NOT AT [facility])  CULTURE, GROUP A STREP [facility])    EKG  EKG Interpretation None       Radiology No results found.  Procedures Procedures (including critical  care time)  Medications Ordered in UC Medications - No data to display   Initial Impression / Assessment and Plan / UC Course  I have reviewed the triage vital signs and the nursing notes.  Pertinent labs & imaging results that were available during my care of the patient were reviewed by me and considered in my medical decision making (see chart for details).    Rapid Strep Negative. Will send throat culture and Tx with ABX if culture positive. Tylenol/Motrin as needed for pain. Saline gargles as advised Voice rest x 48 hrs   Final Clinical Impressions(s) / UC Diagnoses   Final diagnoses:  Pharyngitis due to other organism  Acute laryngitis  Cough    ED Discharge Orders        Ordered    predniSONE (DELTASONE) 20 MG tablet  Daily with breakfast     03/24/17 1225    benzonatate (TESSALON) 100 MG capsule  3 times daily PRN     03/24/17 1225       Controlled Substance Prescriptions [place] Controlled Substance Registry consulted? Not Applicable   Reinaldo Raddle, NP 03/24/17 1228

## 2017-03-24 NOTE — ED Triage Notes (Signed)
Patient started having symptoms of sore throat, cough, and fever 4 days ago. 

## 2017-03-24 NOTE — Discharge Instructions (Addendum)
Rapid Strep Negative. Tylenol/Motrin as needed for pain. Saline gargles as advised Voice rest x 48 hrs

## 2017-03-27 LAB — CULTURE, GROUP A STREP (THRC)

## 2017-11-30 ENCOUNTER — Inpatient Hospital Stay: Payer: Medicaid Other | Attending: Oncology | Admitting: Oncology

## 2017-11-30 ENCOUNTER — Encounter: Payer: Self-pay | Admitting: Oncology

## 2017-11-30 ENCOUNTER — Inpatient Hospital Stay: Payer: Medicaid Other

## 2017-11-30 ENCOUNTER — Telehealth: Payer: Self-pay | Admitting: *Deleted

## 2017-11-30 VITALS — BP 113/78 | HR 80 | Temp 98.0°F | Resp 18 | Ht 69.0 in | Wt 225.5 lb

## 2017-11-30 DIAGNOSIS — Z7952 Long term (current) use of systemic steroids: Secondary | ICD-10-CM

## 2017-11-30 DIAGNOSIS — F17210 Nicotine dependence, cigarettes, uncomplicated: Secondary | ICD-10-CM | POA: Diagnosis not present

## 2017-11-30 DIAGNOSIS — R1012 Left upper quadrant pain: Secondary | ICD-10-CM | POA: Diagnosis not present

## 2017-11-30 DIAGNOSIS — R161 Splenomegaly, not elsewhere classified: Secondary | ICD-10-CM

## 2017-11-30 DIAGNOSIS — D696 Thrombocytopenia, unspecified: Secondary | ICD-10-CM | POA: Insufficient documentation

## 2017-11-30 DIAGNOSIS — Z803 Family history of malignant neoplasm of breast: Secondary | ICD-10-CM | POA: Insufficient documentation

## 2017-11-30 DIAGNOSIS — R162 Hepatomegaly with splenomegaly, not elsewhere classified: Secondary | ICD-10-CM | POA: Insufficient documentation

## 2017-11-30 DIAGNOSIS — Z79899 Other long term (current) drug therapy: Secondary | ICD-10-CM | POA: Diagnosis not present

## 2017-11-30 LAB — CBC WITH DIFFERENTIAL/PLATELET
BASOS ABS: 0 10*3/uL (ref 0–0.1)
Basophils Relative: 1 %
EOS ABS: 0.2 10*3/uL (ref 0–0.7)
Eosinophils Relative: 3 %
HCT: 41.1 % (ref 35.0–47.0)
Hemoglobin: 14.6 g/dL (ref 12.0–16.0)
Lymphocytes Relative: 38 %
Lymphs Abs: 2.5 10*3/uL (ref 1.0–3.6)
MCH: 31.6 pg (ref 26.0–34.0)
MCHC: 35.5 g/dL (ref 32.0–36.0)
MCV: 89 fL (ref 80.0–100.0)
MONO ABS: 0.3 10*3/uL (ref 0.2–0.9)
Monocytes Relative: 5 %
Neutro Abs: 3.5 10*3/uL (ref 1.4–6.5)
Neutrophils Relative %: 53 %
Platelets: 167 10*3/uL (ref 150–440)
RBC: 4.61 MIL/uL (ref 3.80–5.20)
RDW: 13.1 % (ref 11.5–14.5)
WBC: 6.5 10*3/uL (ref 3.6–11.0)

## 2017-11-30 LAB — COMPREHENSIVE METABOLIC PANEL
ALBUMIN: 4.9 g/dL (ref 3.5–5.0)
ALT: 21 U/L (ref 0–44)
AST: 21 U/L (ref 15–41)
Alkaline Phosphatase: 67 U/L (ref 38–126)
Anion gap: 10 (ref 5–15)
BILIRUBIN TOTAL: 0.5 mg/dL (ref 0.3–1.2)
BUN: 13 mg/dL (ref 6–20)
CALCIUM: 9.5 mg/dL (ref 8.9–10.3)
CO2: 22 mmol/L (ref 22–32)
CREATININE: 0.78 mg/dL (ref 0.44–1.00)
Chloride: 105 mmol/L (ref 98–111)
GFR calc Af Amer: >60 mL/min (ref >60)
GFR calc non Af Amer: >60 mL/min (ref >60)
GLUCOSE: 93 mg/dL (ref 70–99)
Potassium: 3.6 mmol/L (ref 3.5–5.1)
Sodium: 137 mmol/L (ref 135–145)
TOTAL PROTEIN: 8 g/dL (ref 6.5–8.1)

## 2017-11-30 LAB — TECHNOLOGIST SMEAR REVIEW

## 2017-11-30 LAB — FOLATE: Folate: 6.7 ng/mL (ref >5.9)

## 2017-11-30 NOTE — Progress Notes (Signed)
Pt here for low plt count. She states that she has pain on left side on and off for a couple o fyears but over last 2 weeks it is felt worse. Rating 3 pain today in left side of abdomen

## 2017-11-30 NOTE — Telephone Encounter (Signed)
I called pt and asked her where she had MRI and she said [facility] in [place]. I could not find it and she states it has changed names to [facility]. I called the office and spoke to [place] and she states that pt would first have to fill out med. Release then we canrequest it or pt could come and get disc. Morrie Sheldon states that pt can go to any [facility] and fill out paper and get disc or go on line. I called patient back to tell her and she will go on line and do it and I will gt back in touch with her in couple of days and then if the release has been done then I will request one at [facility] in [place] to get disc.

## 2017-12-01 LAB — VITAMIN B12: Vitamin B-12: 313 pg/mL (ref 180–914)

## 2017-12-01 LAB — HEPATITIS C ANTIBODY

## 2017-12-02 ENCOUNTER — Encounter: Payer: Self-pay | Admitting: Oncology

## 2017-12-02 NOTE — Progress Notes (Signed)
Hematology/Oncology Consult note [facility] Telephone:([phone] [phone]  Patient Care Team: [facility] as PCP - General   Name of the patient: Tara Nelson  [id number]  June 30, 1985    Reason for referral- splenomegaly and thrombocytopenia   Referring physician- Dr. Gavin Potters  Date of visit: 12/02/17   History of presenting illness- patient is a 32 year old female with no significant medical problem and chronic back pain for which she was recently evaluated by orthopedics.  She had an MRI of her spine the results of which will be for me to review today.  MRI incidentally showed splenomegaly and she has therefore been referred to [place].  On reviewing her outside records patient had a CT abdomen with contrast in [facility] in 2016 which showed even back then.  Liver was mildly enlarged and the spleen measured about 17.2 cm in size.  2.2 cm soft tissue density was seen inferior to the spleen compatible with a splenule.  No adenopathy was seen.  This CT was compared to a CT scan in 2012 and her splenomegaly has remained essentially unchanged.  No adenopathy or malignancy was noted in the chest.  With regards to thrombocytopenia patient has had a normal platelet count between 2012- 2016.  In 2018 and 2019 patient was noted to have a mild thrombocytopenia with a platelet count between 112 -136.  Her white count and hemoglobin have always been normal.  Patient feels well.  Her appetite is good and she denies any unintentional weight loss.  In fact she reports weight gain.  ECOG PS- 0  Pain scale- 0   Review of systems- Review of Systems  Constitutional: Negative for chills, fever, malaise/fatigue and weight loss.  HENT: Negative for congestion, ear discharge and nosebleeds.   Eyes: Negative for blurred vision.  Respiratory: Negative for cough, hemoptysis, sputum production, shortness of breath and wheezing.   Cardiovascular: Negative for chest  pain, palpitations, orthopnea and claudication.  Gastrointestinal: Negative for abdominal pain, blood in stool, constipation, diarrhea, heartburn, melena, nausea and vomiting.  Genitourinary: Negative for dysuria, flank pain, frequency, hematuria and urgency.  Musculoskeletal: Negative for back pain, joint pain and myalgias.  Skin: Negative for rash.  Neurological: Negative for dizziness, tingling, focal weakness, seizures, weakness and headaches.  Endo/Heme/Allergies: Does not bruise/bleed easily.  Psychiatric/Behavioral: Negative for depression and suicidal ideas. The patient does not have insomnia.     Allergies  Allergen Reactions  . Penicillins Rash  . Sulfa Antibiotics Rash  . Toradol [Ketorolac Tromethamine] Nausea And Vomiting    There are no active problems to display for this patient.    Past Medical History:  Diagnosis Date  . Back pain   . Ruptured lumbar disc   . Thrombocytopenia (HCC)      Past Surgical History:  Procedure Laterality Date  . ABDOMINAL HYSTERECTOMY    . ANTERIOR CRUCIATE LIGAMENT REPAIR     both ankles    Social History   Socioeconomic History  . Marital status: Divorced    Spouse name: Not on file  . Number of children: Not on file  . Years of education: Not on file  . Highest education level: Not on file  Occupational History  . Not on file  Social Needs  . Financial resource strain: Not on file  . Food insecurity:    Worry: Not on file    Inability: Not on file  . Transportation needs:    Medical: Not on file    Non-medical:  Not on file  Tobacco Use  . Smoking status: Current Every Day Smoker    Packs/day: 0.50    Types: Cigarettes  . Smokeless tobacco: Never Used  Substance and Sexual Activity  . Alcohol use: No    Frequency: Never    Comment: occ wine  . Drug use: No  . Sexual activity: Not Currently  Lifestyle  . Physical activity:    Days per week: Not on file    Minutes per session: Not on file  . Stress: Not  on file  Relationships  . Social connections:    Talks on phone: Not on file    Gets together: Not on file    Attends religious service: Not on file    Active member of club or organization: Not on file    Attends meetings of clubs or organizations: Not on file    Relationship status: Not on file  . Intimate partner violence:    Fear of current or ex partner: Not on file    Emotionally abused: Not on file    Physically abused: Not on file    Forced sexual activity: Not on file  Other Topics Concern  . Not on file  Social History Narrative  . Not on file     Family History  Problem Relation Age of Onset  . Breast cancer Maternal Grandmother   . Breast cancer Paternal Grandmother      Current Outpatient Medications:  .  cyclobenzaprine (FLEXERIL) 10 MG tablet, Take 10 mg by mouth 2 (two) times daily., Disp: , Rfl:  .  gabapentin (NEURONTIN) 100 MG capsule, Take 100 mg by mouth daily., Disp: , Rfl:  .  oxycodone (OXY-IR) 5 MG capsule, Take 5 mg by mouth 2 (two) times daily., Disp: , Rfl:  .  benzonatate (TESSALON) 100 MG capsule, Take 1 capsule (100 mg total) by mouth 3 (three) times daily as needed for cough., Disp: 21 capsule, Rfl: 0 .  predniSONE (DELTASONE) 20 MG tablet, Take 1 tablet (20 mg total) by mouth daily with breakfast., Disp: 6 tablet, Rfl: 0   Physical exam:  Vitals:   11/30/17 1341  BP: 113/78  Pulse: 80  Resp: 18  Temp: 98 F (36.7 C)  TempSrc: Tympanic  Weight: 225 lb 8.5 oz (102.3 kg)  Height: 5\' 9"  (1.753 m)   Physical Exam  Constitutional: She is oriented to person, place, and time. She appears well-developed and well-nourished.  HENT:  Head: Normocephalic and atraumatic.  Eyes: Pupils are equal, round, and reactive to light. EOM are normal.  Neck: Normal range of motion.  Cardiovascular: Normal rate, regular rhythm and normal heart sounds.  Pulmonary/Chest: Effort normal and breath sounds normal.  Abdominal: Soft. Bowel sounds are normal. She  exhibits no distension. There is no tenderness.  No palpable hepatosplenomegaly  Lymphadenopathy:  No palpable cervical, supraclavicular, axillary or inguinal adenopathy   Neurological: She is alert and oriented to person, place, and time.  Skin: Skin is warm and dry.       CMP Latest Ref Rng & Units 11/30/2017  Glucose 70 - 99 mg/dL 93  BUN 6 - 20 mg/dL 13  Creatinine 1.61 - 0.96 mg/dL 0.45  Sodium 409 - 811 mmol/L 137  Potassium 3.5 - 5.1 mmol/L 3.6  Chloride 98 - 111 mmol/L 105  CO2 22 - 32 mmol/L 22  Calcium 8.9 - 10.3 mg/dL 9.5  Total Protein 6.5 - 8.1 g/dL 8.0  Total Bilirubin 0.3 - 1.2 mg/dL 0.5  Alkaline Phos 38 - 126 U/L 67  AST 15 - 41 U/L 21  ALT 0 - 44 U/L 21   CBC Latest Ref Rng & Units 11/30/2017  WBC 3.6 - 11.0 K/uL 6.5  Hemoglobin 12.0 - 16.0 g/dL 16.1  Hematocrit 09.6 - 47.0 % 41.1  Platelets 150 - 440 K/uL 167    No images are attached to the encounter.  No results found.  Assessment and plan- Patient is a 32 y.o. female referred for splenomegaly and thrombocytopenia  1. 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 Etiology unclear. We will obtain recent mri report and see if spleen size is worse. Clinically I do not palpate any splenomegaly. Splenomegaly can be due to different reasons- infectious (unlikely if it is chronic), malignancy (mild isolated thrombocytopenia. No other cytopenias. No B symptoms and stability in spleen size between 2012-2016 would argue against it alhtough low grade lymphoproliferative disorder is a possibility. ) Storage disorders such as gauchers disease is a possibility. (no known family history)  At this point I will obtain outside MRI report for comparison. If it has not grown in size, I would favor watchful monitoring Q6 months- 1year. If it worsens, will consider BM biopsy at that time  She did test positive for ANA in the past but has no signs and symptoms such as joint pain, swelling skin rash or  oral ulcers  2. Thrombocytopenia: mild and isolated between 110's- 130's. Hep C and HIV was negative. Smear review, b12 and folate today. Continue to monitor  RTC in 1 week to discuss outside MRI results and blood work   Thank you for this kind referral and the opportunity to participate in the care of this  Patient   Visit Diagnosis 1. Thrombocytopenia (HCC)   2. Splenomegaly     Dr. Owens Shark, MD, MPH [facility] [phone] 12/02/2017 11:55 AM

## 2017-12-04 ENCOUNTER — Telehealth: Payer: Self-pay | Admitting: *Deleted

## 2017-12-04 NOTE — Telephone Encounter (Signed)
Called [facility] and wanted to check to see if pt had done the online med. record request so that we can get copy of mri. They did receive it and they will be mailing the report and disc to our [place] office. I requested that could theyplease send paper report to me today in [place] office and he will put request in.

## 2017-12-07 ENCOUNTER — Encounter: Payer: Self-pay | Admitting: Oncology

## 2017-12-07 ENCOUNTER — Inpatient Hospital Stay (HOSPITAL_BASED_OUTPATIENT_CLINIC_OR_DEPARTMENT_OTHER): Payer: Medicaid Other | Admitting: Oncology

## 2017-12-07 VITALS — BP 112/68 | HR 87 | Temp 97.4°F | Resp 18 | Ht 69.0 in | Wt 228.5 lb

## 2017-12-07 DIAGNOSIS — R162 Hepatomegaly with splenomegaly, not elsewhere classified: Secondary | ICD-10-CM

## 2017-12-07 DIAGNOSIS — R161 Splenomegaly, not elsewhere classified: Secondary | ICD-10-CM

## 2017-12-07 DIAGNOSIS — R1012 Left upper quadrant pain: Secondary | ICD-10-CM

## 2017-12-07 DIAGNOSIS — Z803 Family history of malignant neoplasm of breast: Secondary | ICD-10-CM

## 2017-12-07 DIAGNOSIS — F17210 Nicotine dependence, cigarettes, uncomplicated: Secondary | ICD-10-CM | POA: Diagnosis not present

## 2017-12-07 DIAGNOSIS — Z7952 Long term (current) use of systemic steroids: Secondary | ICD-10-CM | POA: Diagnosis not present

## 2017-12-07 DIAGNOSIS — Z79899 Other long term (current) drug therapy: Secondary | ICD-10-CM

## 2017-12-07 NOTE — Progress Notes (Signed)
Hematology/Oncology Consult note [facility]  Telephone:([phone] [phone]  Patient Care Team: [facility] as PCP - General   Name of the patient: Tara Nelson  [id number]  [birth year]   Date of visit: 12/07/17  Diagnosis- hepatosplenomegaly of uncertain etiology  Chief complaint/ Reason for visit- discuss mri results and blood work  Heme/Onc history: patient is a 32 year old female with no significant medical problem and chronic back pain for which she was recently evaluated by orthopedics.  She had an MRI of her spine the results of which will be for me to review today.  MRI incidentally showed splenomegaly of 18.2 cm and she has therefore been referred to [place].  On reviewing her outside records patient had a CT abdomen with contrast in [facility] in 2016 which showed even back then.  Liver was mildly enlarged and the spleen measured about 17.2 cm in size.  2.2 cm soft tissue density was seen inferior to the spleen compatible with a splenule.  No adenopathy was seen.  This CT was compared to a CT scan in 2012 and her splenomegaly has remained essentially unchanged.  No adenopathy or malignancy was noted in the chest.  With regards to thrombocytopenia patient has had a normal platelet count between 2012- 2016.  In 2018 and 2019 patient was noted to have a mild thrombocytopenia with a platelet count between 112 -136.  Her white count and hemoglobin have always been normal.  Patient feels well.  Her appetite is good and she denies any unintentional weight loss.  In fact she reports weight gain.  She denies any symptoms of delayed puberty, problems with ataxia or visual changes.  Denies any prior bony fractures or family history of any metabolic disorders.  Results of blood work from 11/30/2017 were as follows: CBC showed white count 6.5, H&H of 14.6/21.1 and a platelet count of 167.  Differential was normal.  Vitamin B12, folate and antibody was  unremarkable.  Peripheral smear review was unremarkable.  CMP was normal with normal liver functions  Interval history- Patient is upset that she does not have any answers about her splenomegaly. She reports LUQ abdominal pain since last 6-8 months  ECOG PS- 0 Pain scale- 0   Review of systems- Review of Systems  Constitutional: Negative for chills, fever, malaise/fatigue and weight loss.  HENT: Negative for congestion, ear discharge and nosebleeds.   Eyes: Negative for blurred vision.  Respiratory: Negative for cough, hemoptysis, sputum production, shortness of breath and wheezing.   Cardiovascular: Negative for chest pain, palpitations, orthopnea and claudication.  Gastrointestinal: Positive for abdominal pain. Negative for blood in stool, constipation, diarrhea, heartburn, melena, nausea and vomiting.  Genitourinary: Negative for dysuria, flank pain, frequency, hematuria and urgency.  Musculoskeletal: Positive for back pain. Negative for joint pain and myalgias.  Skin: Negative for rash.  Neurological: Negative for dizziness, tingling, focal weakness, seizures, weakness and headaches.  Endo/Heme/Allergies: Does not bruise/bleed easily.  Psychiatric/Behavioral: Negative for depression and suicidal ideas. The patient does not have insomnia.       Allergies  Allergen Reactions  . Penicillins Rash  . Sulfa Antibiotics Rash  . Toradol [Ketorolac Tromethamine] Nausea And Vomiting     Past Medical History:  Diagnosis Date  . Back pain   . Ruptured lumbar disc   . Thrombocytopenia ([place])      Past Surgical History:  Procedure Laterality Date  . ABDOMINAL HYSTERECTOMY    . ANTERIOR CRUCIATE LIGAMENT REPAIR  both ankles    Social History   Socioeconomic History  . Marital status: Divorced    Spouse name: Not on file  . Number of children: Not on file  . Years of education: Not on file  . Highest education level: Not on file  Occupational History  . Not on file    Social Needs  . Financial resource strain: Not on file  . Food insecurity:    Worry: Not on file    Inability: Not on file  . Transportation needs:    Medical: Not on file    Non-medical: Not on file  Tobacco Use  . Smoking status: Current Every Day Smoker    Packs/day: 0.50    Types: Cigarettes  . Smokeless tobacco: Never Used  Substance and Sexual Activity  . Alcohol use: No    Frequency: Never    Comment: occ wine  . Drug use: No  . Sexual activity: Not Currently  Lifestyle  . Physical activity:    Days per week: Not on file    Minutes per session: Not on file  . Stress: Not on file  Relationships  . Social connections:    Talks on phone: Not on file    Gets together: Not on file    Attends religious service: Not on file    Active member of club or organization: Not on file    Attends meetings of clubs or organizations: Not on file    Relationship status: Not on file  . Intimate partner violence:    Fear of current or ex partner: Not on file    Emotionally abused: Not on file    Physically abused: Not on file    Forced sexual activity: Not on file  Other Topics Concern  . Not on file  Social History Narrative  . Not on file    Family History  Problem Relation Age of Onset  . Breast cancer Maternal Grandmother   . Breast cancer Paternal Grandmother      Current Outpatient Medications:  .  cyclobenzaprine (FLEXERIL) 10 MG tablet, Take 10 mg by mouth 2 (two) times daily., Disp: , Rfl:  .  gabapentin (NEURONTIN) 100 MG capsule, Take 100 mg by mouth daily., Disp: , Rfl:  .  oxycodone (OXY-IR) 5 MG capsule, Take 5 mg by mouth 2 (two) times daily., Disp: , Rfl:  .  benzonatate (TESSALON) 100 MG capsule, Take 1 capsule (100 mg total) by mouth 3 (three) times daily as needed for cough. (Patient not taking: Reported on 12/07/2017), Disp: 21 capsule, Rfl: 0 .  predniSONE (DELTASONE) 20 MG tablet, Take 1 tablet (20 mg total) by mouth daily with breakfast. (Patient not  taking: Reported on 12/07/2017), Disp: 6 tablet, Rfl: 0  Physical exam:  Vitals:   12/07/17 1427  BP: 112/68  Pulse: 87  Resp: 18  Temp: (!) 97.4 F (36.3 C)  TempSrc: Tympanic  SpO2: 100%  Weight: 228 lb 8.1 oz (103.7 kg)  Height: 5' 9"  (1.753 m)   Physical Exam  Constitutional: She is oriented to person, place, and time. She appears well-developed and well-nourished.  HENT:  Head: Normocephalic and atraumatic.  Eyes: Pupils are equal, round, and reactive to light. EOM are normal.  Neck: Normal range of motion.  Cardiovascular: Normal rate, regular rhythm and normal heart sounds.  Pulmonary/Chest: Effort normal and breath sounds normal.  Abdominal: Soft. Bowel sounds are normal.  Neurological: She is alert and oriented to person, place, and time.  Skin: Skin is warm and dry.     CMP Latest Ref Rng & Units 11/30/2017  Glucose 70 - 99 mg/dL 93  BUN 6 - 20 mg/dL 13  Creatinine 0.44 - 1.00 mg/dL 0.78  Sodium 135 - 145 mmol/L 137  Potassium 3.5 - 5.1 mmol/L 3.6  Chloride 98 - 111 mmol/L 105  CO2 22 - 32 mmol/L 22  Calcium 8.9 - 10.3 mg/dL 9.5  Total Protein 6.5 - 8.1 g/dL 8.0  Total Bilirubin 0.3 - 1.2 mg/dL 0.5  Alkaline Phos 38 - 126 U/L 67  AST 15 - 41 U/L 21  ALT 0 - 44 U/L 21   CBC Latest Ref Rng & Units 11/30/2017  WBC 3.6 - 11.0 K/uL 6.5  Hemoglobin 12.0 - 16.0 g/dL 14.6  Hematocrit 35.0 - 47.0 % 41.1  Platelets 150 - 440 K/uL 167     Assessment and plan- Patient is a 32 y.o. female with hepatosplenomegaly of uncertain etiology  Patient has had mild hepatomegaly and moderate splenomegaly since 2012.  Back in 2012 and 2016 her spleen size and CT scan was 17.2 cm and was noted to be 18.2 cm on her recent MRI spine.  Overall it has remained stable in size.  With the etiology for her splenomegaly-clinically patient does not have any signs and symptoms of heart failure or cirrhosis.  LFTs are normal  CBC with differential is normal and prior CT scans have not shown any  evidence of malignancy either.  A low-grade indolent variety of lymphoma is a possibility that is causing the splenomegaly.  However given that her peripheral counts are normal and her spleen size has remained stable over the last 7 years, I do not think that a bone marrow biopsy is indicated at this time as it would not change management.  Chronic splenomegaly also argues against infection as the cause.  He did have a positive ANA in the past but clinically he does not have any signs of SLE sarcoid or rheumatoid arthritis.  No family history of metabolic disorders.  No personal history of delayed puberty, bone fractures or vision or balance issues that would be suggestive of Gaucher's disease.  I think at this point her splenomegaly can be monitored conservatively with another ultrasound in 6 months and if her spleen signs remained stable this can be monitored subsequently on a yearly basis although the cause remains unclear.  Patient is however upset and would like to know answers as to why her spleen size is enlarged.  Splenic biopsy is not indicated given the risk of bleeding.  I also do not see an indication for splenectomy at this time   I would refer her to [facility] hematology for second opinion and patient would like to subsequently follow-up with them in the future.  She will therefore not be getting any follow-up appointments with me at this time.  With regards to thrombocytopenia that was mild intermittent in 2018.  On repeat lab work on 11/30/2017 there was no evidence of thrombocytopenia.  Also with regards to her left upper quadrant pain I do not think that this could be related to her splenomegaly as the spleen has been stable over the last 7 years.   Visit Diagnosis 1. Splenomegaly      Dr. Randa Evens, MD, MPH [facility] [phone] 12/07/2017 3:01 PM

## 2017-12-14 ENCOUNTER — Telehealth: Payer: Self-pay | Admitting: *Deleted

## 2017-12-14 NOTE — Telephone Encounter (Signed)
I called pt to let her know that I called hematology and was told that there next appt is 05/2018.  I thought it was too far out so I did not take it. I spoke to Smith RobertRao and she said since your scans showed hepatosplenomegaly has been pretty stable over last several years that is ok to wait til then. I can also try to find hematolgy through [facility] but not on main campus or go to [facility]. She would like a sooner appt and I will keep trying and let her know. She is agreeable to this.

## 2017-12-17 ENCOUNTER — Encounter: Payer: Self-pay | Admitting: Family Medicine

## 2017-12-17 ENCOUNTER — Encounter: Payer: Self-pay | Admitting: Physical Medicine and Rehabilitation

## 2017-12-29 ENCOUNTER — Telehealth: Payer: Self-pay

## 2017-12-29 NOTE — Telephone Encounter (Signed)
Spoke to [facility] hematology & oncology and she states they will see patient for hepatosplenomegaly. Insurance card and demographics need to be faxed to [phone]. Physicians will review chart and call patient to give her appt within the next few weeks.

## 2017-12-30 NOTE — Telephone Encounter (Signed)
I contacted patient today and let her know that we have found a heme group at [facility] that we will see her in the next 2 to 3 weeks.  I did also call over to the group and they said there is 2 different locations where they see patients so we should wait until they review the clinical information and call us back with an appointment to know the location specifically the patient will be going to.  I also told this to the patient and I will keep this on my desk and review it once a week until we get an appointment and then let patient know.

## 2018-01-07 ENCOUNTER — Telehealth: Payer: Self-pay | Admitting: *Deleted

## 2018-01-07 NOTE — Telephone Encounter (Signed)
I called over to the office at [facility] and got transferred to the new patient coordinator and left a message to see the status of this patient if she is gotten an appointment yet.  I will await a phone call back from the coordinator to tell me the status

## 2018-01-22 ENCOUNTER — Telehealth: Payer: Self-pay

## 2018-01-22 NOTE — Telephone Encounter (Signed)
Spoke with [facility] hematology / Onc to see if new patient information has been received / yes it was received and still in review. It may be a answer by Monday.

## 2018-01-28 ENCOUNTER — Telehealth: Payer: Self-pay

## 2018-01-28 NOTE — Telephone Encounter (Signed)
Spoke with ( Ms. Hospital doctor ) at Hematology/ Oncology at [facility]. Ms Joice Lofts states that this is not a dx( Splenomegaly) that they provider normally see. The patient chart is still in review and awaiting on a provider to give Ms Amy an ok to schedule the patient for an new appointment. At this time the patient has not been schedule for a appointment.

## 2018-02-01 ENCOUNTER — Telehealth: Payer: Self-pay | Admitting: *Deleted

## 2018-02-01 NOTE — Telephone Encounter (Signed)
Called patient to let her know that we have an appt at [facility]- we have been checking weekly to see if they have an appt for the patient.  According to Care coordinator Cassandra. The patient is not a typical pt. To come here. She will get lots of blood work and poss. Bone marrow bx.  She gave me the appt 11/18 1 pm for labs and 2 pm to see Dr. Kyla Balzarine.  I gave the appt info to patient and let her know that she is not the typical pt to come there due to enlarged spleen and isolate low plt count. She would like to go.  The new pt. Coordinator will mail her a packet in the mail and it has directions but I told her the address [address] and it would be the cancer center. She can call me if she has additional questions. Pt is agreeable to the plan

## 2018-08-24 ENCOUNTER — Other Ambulatory Visit: Payer: Self-pay

## 2018-08-24 ENCOUNTER — Ambulatory Visit
Admission: EM | Admit: 2018-08-24 | Discharge: 2018-08-24 | Disposition: A | Discharge status: Home / Self Care | Payer: Medicaid Other | Attending: Urgent Care | Admitting: Urgent Care

## 2018-08-24 ENCOUNTER — Encounter: Payer: Self-pay | Admitting: Emergency Medicine

## 2018-08-24 DIAGNOSIS — L02214 Cutaneous abscess of groin: Secondary | ICD-10-CM | POA: Diagnosis not present

## 2018-08-24 MED ORDER — BACITRACIN ZINC 500 UNIT/GM EX OINT: 1.0000 "application " | TOPICAL_OINTMENT | Freq: Two times a day (BID) | CUTANEOUS | 0 refills | Status: DC

## 2018-08-24 MED ORDER — DOXYCYCLINE HYCLATE 100 MG PO CAPS: 100.0000 mg | ORAL_CAPSULE | Freq: Two times a day (BID) | ORAL | 0 refills | Status: DC

## 2018-08-24 MED ORDER — BACITRACIN ZINC 500 UNIT/GM EX OINT
TOPICAL_OINTMENT | Freq: Once | CUTANEOUS | Status: AC
  Administered 2018-08-24: 1 via TOPICAL

## 2018-08-24 NOTE — ED Triage Notes (Signed)
Patient c/o red tender bump in her right groin since Thursday.  Patient denies fevers.

## 2018-08-24 NOTE — Discharge Instructions (Addendum)
It was very nice meeting you today in clinic. Thank you for entrusting me with your care.   As discussed, you have an infected abscess. Keep area clean and dry. Antibiotic ointment will also help. Cover with dry dressing to prevent drainage from soiling clothing. As discussed, this is likely MRSA. I will give you some information on it to read over. It is contagious. I wont know for sure that you have the bacteria until your culture is back, but I wanted you to be aware of the potential. Wash hands often.   Please utilize the medications that we discussed. Your prescriptions have been called in to your pharmacy. Warm compresses will help swelling and promote further drainage.   Make arrangements to follow up with your regular doctor in 1 week for re-evaluation if not getting better. If your symptoms/condition worsens, please seek follow up care either here or in the ER. Please remember, our [facility] providers are "right here with you" when you need [place].   Again, it was my pleasure to take care of you today. Thank you for choosing our clinic. I hope that you start to feel better quickly.   Quentin Mulling, MSN, APRN, FNP-C, CEN Advanced Practice Provider  [facility]

## 2018-08-24 NOTE — ED Provider Notes (Signed)
[address], [address] [place], [place] [id number] [phone]   Name: Tara Nelson DOB: 10/12/1985 MRN: [id number] CSN: [id number] [place] [facility] [place]  Arrival date and time:  08/24/18 1039  Chief Complaint:  Abscess (right groin)  NOTE: Prior to seeing the patient today, I have reviewed the triage nursing documentation and vital signs. Clinical staff has updated patient's PMH/PSHx, current medication list, and drug allergies/intolerances to ensure comprehensive history available to assist in medical decision making.   History:   HPI: Tara Nelson is a 33 y.o. female who presents today with complaints of an abscess to her her RIGHT groin. She initially advised clinical staff that area declared on Thursday (08/19/2018), however she advised provider that area first declared on Sunday (08/15/2018). Patient presents with concerns that abscess increasing in size and becoming more erythematous. Abscess has been draining sanguinopurulent drainage. Patient denies fevers. PMH is not significant for recurrent abscesses.   Patient present normotensive, afebrile, and tachycardic to the 110s. Patient observed drinking coffee in the exam room, which she notes will generally cause her heart rate to elevate.   Past Medical History:  Diagnosis Date  . Back pain   . Ruptured lumbar disc   . Thrombocytopenia ([facility])     Past Surgical History:  Procedure Laterality Date  . ABDOMINAL HYSTERECTOMY    . ANTERIOR CRUCIATE LIGAMENT REPAIR     both ankles    Family History  Problem Relation Age of Onset  . Breast cancer Maternal Grandmother   . Breast cancer Paternal Grandmother     Social History   Socioeconomic History  . Marital status: Divorced    Spouse name: Not on file  . Number of children: Not on file  . Years of education: Not on file  . Highest education level: Not on file  Occupational History  . Not on file  Social Needs  . Financial resource strain: Not on file   . Food insecurity:    Worry: Not on file    Inability: Not on file  . Transportation needs:    Medical: Not on file    Non-medical: Not on file  Tobacco Use  . Smoking status: Current Every Day Smoker    Packs/day: 0.50    Types: Cigarettes  . Smokeless tobacco: Never Used  Substance and Sexual Activity  . Alcohol use: No    Frequency: Never    Comment: occ wine  . Drug use: No  . Sexual activity: Not Currently  Lifestyle  . Physical activity:    Days per week: Not on file    Minutes per session: Not on file  . Stress: Not on file  Relationships  . Social connections:    Talks on phone: Not on file    Gets together: Not on file    Attends religious service: Not on file    Active member of club or organization: Not on file    Attends meetings of clubs or organizations: Not on file    Relationship status: Not on file  . Intimate partner violence:    Fear of current or ex partner: Not on file    Emotionally abused: Not on file    Physically abused: Not on file    Forced sexual activity: Not on file  Other Topics Concern  . Not on file  Social History Narrative  . Not on file    There are no active problems to display for this patient.   Home Medications:  Current Meds  Medication Sig  . cyclobenzaprine (FLEXERIL) 10 MG tablet Take 10 mg by mouth 2 (two) times daily.  Marland Kitchen gabapentin (NEURONTIN) 100 MG capsule Take 100 mg by mouth daily.  Marland Kitchen oxycodone (OXY-IR) 5 MG capsule Take 5 mg by mouth 2 (two) times daily.    Allergies:   Penicillins; Sulfa antibiotics; and Toradol [ketorolac tromethamine]  Review of Systems (ROS): Review of Systems  Constitutional: Negative for chills and fever.  Respiratory: Negative for cough and shortness of breath.   Cardiovascular: Negative for chest pain and palpitations.  Gastrointestinal: Negative for nausea and vomiting.  Skin: Positive for color change.       Abscess to RIGHT groin  Neurological: Negative for dizziness and  headaches.  Hematological: Negative for adenopathy.     Physical Exam:  Triage Vital Signs ED Triage Vitals  Enc Vitals Group     BP 08/24/18 1050 104/74     Pulse Rate 08/24/18 1050 (!) 110     Resp 08/24/18 1050 16     Temp 08/24/18 1050 98.2 F (36.8 C)     Temp Source 08/24/18 1050 Oral     SpO2 08/24/18 1050 100 %     Weight 08/24/18 1047 225 lb (102.1 kg)     Height 08/24/18 1047 5\' 7"  (1.702 m)     Head Circumference --      Peak Flow --      Pain Score 08/24/18 1047 3     Pain Loc --      Pain Edu? --      Excl. in GC? --     Physical Exam  Constitutional: She is oriented to person, place, and time and well-developed, well-nourished, and in no distress.  HENT:  Head: Normocephalic and atraumatic.  Mouth/Throat: Oropharynx is clear and moist and mucous membranes are normal.  Eyes: Pupils are equal, round, and reactive to light. EOM are normal.  Cardiovascular: Regular rhythm, normal heart sounds and intact distal pulses. Tachycardia present. Exam reveals no gallop and no friction rub.  No murmur heard. Pulmonary/Chest: Effort normal and breath sounds normal. No respiratory distress. She has no wheezes. She has no rales.  Neurological: She is alert and oriented to person, place, and time.  Skin: Skin is warm and dry. Lesion (large abscess to RIGHT groin that is open and draining copious amounts of sanguinopurulent exudate. Able to manually express large amount of drainage from abscess. Area exquisitely tender.) noted. No rash noted. There is erythema.     (+) associated intertriginous irritation  Psychiatric: Mood, affect and judgment normal.  Nursing note and vitals reviewed.    Urgent Care Treatments / Results:   LABS: PLEASE NOTE: all labs that were ordered this encounter are listed, however only abnormal results are displayed. Labs Reviewed  AEROBIC CULTURE (SUPERFICIAL SPECIMEN)    EKG: -None  RADIOLOGY: No results found.  PRODEDURES: Procedures   MEDICATIONS RECEIVED THIS VISIT: Medications  bacitracin ointment (1 application Topical Given 08/24/18 1115)    PERTINENT CLINICAL COURSE NOTES/UPDATES:   Initial Impression / Assessment and Plan / Urgent Care Course:    Tara Nelson is a 33 y.o. female who presents to [facility] today with complaints of Abscess (right groin)  Pertinent labs & imaging results that were available during my care of the patient were personally reviewed by me and considered in my medical decision making (see lab/imaging section of note for values and interpretations).  Exam reveals large abscess to RIGHT groin  that is open and draining. Area cleansed and aerobic culture collected and sent for testing. Discussed potential MRSA infection and encouraged patient to keep area clean, dry, and covered to prevent transmission. Discussed intertriginous irritation and need to ensure skin folds are kept dry to prevent skin breakdown. No concerns for concurrent fungal intertrigo at this point. Educated on frequent handwashing after touching area. Bacitracin applied and bulky dressing applied by nursing. Patient has surrounding irritation. Will Rx Bacitracin topical BID for the next week. Patient allergic to PCN and sulfa. Will treat infected abscess with 10 day course of doxycycline. May use Tylenol and/or Ibuprofen as needed for pain. Educated on signs of systemic infection and need for further evaluation/care.   Discussed follow up with primary care physician in 1 week for re-evaluation. I have reviewed the follow up and strict return precautions for any new or worsening symptoms. Patient is aware of symptoms that would be deemed urgent/emergent, and would thus require further evaluation either here or in the emergency department. At the time of discharge, she verbalized understanding and consent with the discharge plan as it was reviewed with her. All questions were fielded by provider and/or clinic staff prior to  patient discharge.    Final Clinical Impressions(s) / Urgent Care Diagnoses:   Final diagnoses:  Abscess of groin, right    New Prescriptions:   Meds ordered this encounter  Medications  . doxycycline (VIBRAMYCIN) 100 MG capsule    Sig: Take 1 capsule (100 mg total) by mouth 2 (two) times daily.    Dispense:  20 capsule    Refill:  0  . bacitracin ointment  . bacitracin ointment    Sig: Apply 1 application topically 2 (two) times daily.    Dispense:  14 g    Refill:  0    Controlled Substance Prescriptions:  [place] Controlled Substance Registry consulted? Not Applicable  NOTE: This note was prepared using Dragon dictation software along with smaller phrase technology. Despite my best ability to proofread, there is the potential that transcriptional errors may still occur from this process, and are completely unintentional.     Verlee Monte, NP 08/25/18 0206

## 2018-08-27 LAB — AEROBIC CULTURE W GRAM STAIN (SUPERFICIAL SPECIMEN)

## 2018-08-27 LAB — AEROBIC CULTURE? (SUPERFICIAL SPECIMEN)

## 2018-08-30 ENCOUNTER — Telehealth (HOSPITAL_COMMUNITY): Payer: Self-pay | Admitting: Emergency Medicine

## 2018-08-30 NOTE — Telephone Encounter (Signed)
Patient contacted and made aware of all results, all questions answered.   

## 2018-10-28 ENCOUNTER — Ambulatory Visit
Admission: EM | Admit: 2018-10-28 | Discharge: 2018-10-28 | Disposition: A | Discharge status: Home / Self Care | Payer: Medicaid Other | Attending: Urgent Care | Admitting: Urgent Care

## 2018-10-28 ENCOUNTER — Other Ambulatory Visit: Payer: Self-pay

## 2018-10-28 DIAGNOSIS — L299 Pruritus, unspecified: Secondary | ICD-10-CM

## 2018-10-28 MED ORDER — KETOCONAZOLE 2 % EX SHAM: 1.0000 "application " | MEDICATED_SHAMPOO | CUTANEOUS | 0 refills | Status: DC

## 2018-10-28 MED ORDER — HYDROXYZINE HCL 25 MG PO TABS: 25.0000 mg | ORAL_TABLET | Freq: Three times a day (TID) | ORAL | 0 refills | Status: DC | PRN

## 2018-10-28 NOTE — ED Triage Notes (Signed)
Pt states she is having bumps in her head that itches after getting in her friends hot tub.

## 2018-10-28 NOTE — Discharge Instructions (Signed)
It was very nice seeing you today in clinic. Thank you for entrusting me with your care.  ° °Please utilize the medications that we discussed. Your prescriptions have been called in to your pharmacy.  ° °Make arrangements to follow up with your regular doctor in 1 week for re-evaluation if not improving. If your symptoms/condition worsens, please seek follow up care either here or in the ER. Please remember, our [facility] providers are "right here with you" when you need us.  ° °Again, it was my pleasure to take care of you today. Thank you for choosing our clinic. I hope that you start to feel better quickly.  ° °Tara Valdes, MSN, APRN, FNP-C, CEN °Advanced Practice Provider °[facility] ° °

## 2018-10-28 NOTE — ED Provider Notes (Signed)
[place]   Name: Tara MolderBrittney A Delange DOB: 07/26/1985 MRN: [id number] CSN: [id number] [place]  Arrival date and time:  10/28/18 1629  Chief Complaint:  Scalp lesion   NOTE: Prior to seeing the patient today, I have reviewed the triage nursing documentation and vital signs. Clinical staff has updated patient's PMH/PSHx, current medication list, and drug allergies/intolerances to ensure comprehensive history available to assist in medical decision making.   History:   HPI: Tara MolderBrittney A Kennan is a 33 y.o. female who presents today with complaints of "bumps" that declared in her scalp after being in her friend's hot tub. Patient advising that her scalp has been pruritic. Scalp has been itching for "weeks", while the bumps were initially discovered x 2 days ago. Patient denies scalp tenderness. She has not appreciated any drainage from the scalp lesions. No one else has close to her has similar symptoms. Itching improves "some", but when she gets back in the hot tub, she notes a recurrent exacerbation. Patient attributing symptoms to the chemicals used in the hot tub. Patient has not noted a skin rash to any other part of her body. She denies any overt allergic reactions symptoms beyond the itching.    Past Medical History:  Diagnosis Date  . Back pain   . Ruptured lumbar disc   . Thrombocytopenia (HCC)     Past Surgical History:  Procedure Laterality Date  . ABDOMINAL HYSTERECTOMY    . ANTERIOR CRUCIATE LIGAMENT REPAIR     both ankles    Family History  Problem Relation Age of Onset  . Breast cancer Maternal Grandmother   . Breast cancer Paternal Grandmother     Social History   Tobacco Use  . Smoking status: Current Every Day Smoker    Packs/day: 0.50    Types: Cigarettes  . Smokeless tobacco: Never Used  Substance Use Topics  . Alcohol use: Yes    Frequency: Never    Comment: occ wine  . Drug use: No    There are no active problems to display for  this patient.   Home Medications:    Current Meds  Medication Sig  . cyclobenzaprine (FLEXERIL) 10 MG tablet Take 10 mg by mouth 2 (two) times daily.  Marland Kitchen. gabapentin (NEURONTIN) 100 MG capsule Take 100 mg by mouth daily.  Marland Kitchen. oxycodone (OXY-IR) 5 MG capsule Take 5 mg by mouth 2 (two) times daily.    Allergies:   Penicillins, Sulfa antibiotics, and Toradol [ketorolac tromethamine]  Review of Systems (ROS): Review of Systems  Constitutional: Negative for chills and fever.  Respiratory: Negative for cough and shortness of breath.   Cardiovascular: Negative for chest pain and palpitations.  Skin: Positive for color change and rash.  All other systems reviewed and are negative.    Vital Signs: Today's Vitals   10/28/18 1719  PainSc: 0-No pain    Physical Exam: Physical Exam  Constitutional: She is oriented to person, place, and time and well-developed, well-nourished, and in no distress.  HENT:  Head: Normocephalic and atraumatic.  Mouth/Throat: Mucous membranes are normal.  Eyes: Pupils are equal, round, and reactive to light. EOM are normal.  Cardiovascular: Normal rate, regular rhythm, normal heart sounds and intact distal pulses. Exam reveals no gallop and no friction rub.  No murmur heard. Pulmonary/Chest: Effort normal and breath sounds normal. No respiratory distress. She has no wheezes. She has no rales.  Neurological: She is alert and oriented to person, place, and time. Gait normal.  GCS score is 15.  Skin: Skin is warm and dry.  Scattered areas of erythema noted about scalp. Pinpoint areas of bleeding corresponding to where patient is feeling the "bumps". No evidence of pediculosis.   Psychiatric: Mood, memory, affect and judgment normal.  Nursing note and vitals reviewed.   Urgent Care Treatments / Results:   LABS: PLEASE NOTE: all labs that were ordered this encounter are listed, however only abnormal results are displayed. Labs Reviewed - No data to display   EKG: -None  RADIOLOGY: No results found.  PROCEDURES: Procedures  MEDICATIONS RECEIVED THIS VISIT: Medications - No data to display  PERTINENT CLINICAL COURSE NOTES/UPDATES:   Initial Impression / Assessment and Plan / Urgent Care Course:  Pertinent labs & imaging results that were available during my care of the patient were personally reviewed by me and considered in my medical decision making (see lab/imaging section of note for values and interpretations).  RAMYAH PANKOWSKI is a 33 y.o. female who presents to [facility] today with complaints of Scalp lesion   Patient is well appearing overall in clinic today. She does not appear to be in any acute distress. Presenting symptoms (see HPI) and exam as documented above. There is no evidence of pediculosis. Suspect irritant scalp dermatitis related to exposure to the chemicals in her friend's hot tub. Patient noting that the pruritis is "driving her crazy". Patient advised to avoid known irritant. Will treat with medicated (ketoconozole) shampoo and assess for improvement; patient to call if not improving. Will provide supply of hydroxyzine for PRN use to help with her pruritis. Discussed that unresolved symptoms may warrant further evaluation by dermatology for more targeted treatment strategy.   Discussed follow up with primary care physician in 1 week for re-evaluation. I have reviewed the follow up and strict return precautions for any new or worsening symptoms. Patient is aware of symptoms that would be deemed urgent/emergent, and would thus require further evaluation either here or in the emergency department. At the time of discharge, she verbalized understanding and consent with the discharge plan as it was reviewed with her. All questions were fielded by provider and/or clinic staff prior to patient discharge.    Final Clinical Impressions / Urgent Care Diagnoses:   Final diagnoses:  Scalp pruritus    New Prescriptions:   [place] Controlled Substance Registry consulted? Not Applicable  Meds ordered this encounter  Medications  . ketoconazole (NIZORAL) 2 % shampoo    Sig: Apply 1 application topically 2 (two) times a week.    Dispense:  120 mL    Refill:  0  . hydrOXYzine (ATARAX/VISTARIL) 25 MG tablet    Sig: Take 1 tablet (25 mg total) by mouth every 8 (eight) hours as needed.    Dispense:  12 tablet    Refill:  0    Recommended Follow up Care:  Patient encouraged to follow up with the following provider within the specified time frame, or sooner as dictated by the severity of her symptoms. As always, she was instructed that for any urgent/emergent care needs, she should seek care either here or in the emergency department for more immediate evaluation.  Follow-up Information    [place] In 1 week.   Why: General reassessment of symptoms if not improving Contact information: [address] [place] [address] [place] [address] [phone]         NOTE: This note was prepared using Dragon dictation software along with smaller phrase technology. Despite my  best ability to proofread, there is the potential that transcriptional errors may still occur from this process, and are completely unintentional.     Verlee MonteGray, Jaclin Finks E, NP 10/28/18 2116

## 2019-08-31 ENCOUNTER — Other Ambulatory Visit: Payer: Self-pay

## 2019-08-31 ENCOUNTER — Ambulatory Visit (INDEPENDENT_AMBULATORY_CARE_PROVIDER_SITE_OTHER): Payer: Medicaid Other

## 2019-08-31 ENCOUNTER — Ambulatory Visit
Admission: EM | Admit: 2019-08-31 | Discharge: 2019-08-31 | Disposition: A | Discharge status: Home / Self Care | Payer: Medicaid Other | Attending: Family Medicine | Admitting: Family Medicine

## 2019-08-31 DIAGNOSIS — R05 Cough: Secondary | ICD-10-CM

## 2019-08-31 DIAGNOSIS — R059 Cough, unspecified: Secondary | ICD-10-CM

## 2019-08-31 MED ORDER — BENZONATATE 200 MG PO CAPS: 200.0000 mg | ORAL_CAPSULE | Freq: Three times a day (TID) | ORAL | 0 refills | Status: AC | PRN

## 2019-08-31 NOTE — ED Provider Notes (Signed)
[facility]    CSN: [id number] Arrival date & time: 08/31/19  1742  History   Chief Complaint Chief Complaint  Patient presents with  . Cough   HPI  34 year old female presents with cough.  Patient reports cough for the past week.  Denies fever.  Denies any other respiratory symptoms.  Has a sick child who has respiratory symptoms and fever.  No relieving factors.  No other associated symptoms.  No other complaints.  Past Medical History:  Diagnosis Date  . Back pain   . Ruptured lumbar disc   . Thrombocytopenia (HCC)    Past Surgical History:  Procedure Laterality Date  . ABDOMINAL HYSTERECTOMY    . ANTERIOR CRUCIATE LIGAMENT REPAIR     both ankles    OB History   No obstetric history on file.      Home Medications    Prior to Admission medications   Medication Sig Start Date End Date Taking? Authorizing Provider  cyclobenzaprine (FLEXERIL) 10 MG tablet Take 10 mg by mouth 2 (two) times daily.   Yes [provider]  FLUoxetine (PROZAC) 10 MG capsule 1 po daily x 1 week, then 2 po daily 08/29/19  Yes [provider]  gabapentin (NEURONTIN) 100 MG capsule Take 100 mg by mouth daily.   Yes [provider]  oxycodone (OXY-IR) 5 MG capsule Take 5 mg by mouth 2 (two) times daily.   Yes [provider]  benzonatate (TESSALON) 200 MG capsule Take 1 capsule (200 mg total) by mouth 3 (three) times daily as needed for cough. 08/31/19   Tommie Sams, DO    Family History Family History  Problem Relation Age of Onset  . Breast cancer Maternal Grandmother   . Breast cancer Paternal Grandmother     Social History Social History   Tobacco Use  . Smoking status: Current Every Day Smoker    Packs/day: 0.50    Types: Cigarettes  . Smokeless tobacco: Never Used  Substance Use Topics  . Alcohol use: Yes    Comment: occ wine  . Drug use: No     Allergies   Penicillins, Sulfa antibiotics, and Toradol [ketorolac  tromethamine]   Review of Systems Review of Systems  Constitutional: Negative for fever.  HENT: Negative.   Respiratory: Positive for cough.    Physical Exam Triage Vital Signs ED Triage Vitals  Enc Vitals Group     BP 08/31/19 1827 (!) 124/95     Pulse Rate 08/31/19 1827 (!) 104     Resp 08/31/19 1827 18     Temp 08/31/19 1827 98.1 F (36.7 C)     Temp Source 08/31/19 1827 Oral     SpO2 08/31/19 1827 98 %     Weight 08/31/19 1824 225 lb 1.4 oz (102.1 kg)     Height 08/31/19 1824 5\' 7"  (1.702 m)     Head Circumference --      Peak Flow --      Pain Score 08/31/19 1823 0     Pain Loc --      Pain Edu? --      Excl. in GC? --    Updated Vital Signs BP (!) 124/95 (BP Location: Left Arm)   Pulse (!) 104   Temp 98.1 F (36.7 C) (Oral)   Resp 18   Ht 5\' 7"  (1.702 m)   Wt 102.1 kg   SpO2 98%   BMI 35.25 kg/m   Visual Acuity Right Eye Distance:  Left Eye Distance:   Bilateral Distance:    Right Eye Near:   Left Eye Near:    Bilateral Near:     Physical Exam Vitals and nursing note reviewed.  Constitutional:      General: She is not in acute distress.    Appearance: Normal appearance. She is not ill-appearing.  HENT:     Head: Normocephalic and atraumatic.  Eyes:     General:        Right eye: No discharge.        Left eye: No discharge.     Conjunctiva/sclera: Conjunctivae normal.  Cardiovascular:     Rate and Rhythm: Normal rate and regular rhythm.     Heart sounds: No murmur.  Pulmonary:     Effort: Pulmonary effort is normal.     Breath sounds: Normal breath sounds. No wheezing, rhonchi or rales.  Neurological:     Mental Status: She is alert.  Psychiatric:        Mood and Affect: Mood normal.        Behavior: Behavior normal.    UC Treatments / Results  Labs (all labs ordered are listed, but only abnormal results are displayed) Labs Reviewed - No data to display  EKG   Radiology DG Chest 2 View  Result Date: 08/31/2019 CLINICAL DATA:   Cough EXAM: CHEST - 2 VIEW COMPARISON:  June 14, 2007 FINDINGS: The heart size and mediastinal contours are within normal limits. Both lungs are clear. The visualized skeletal structures are unremarkable. IMPRESSION: No active cardiopulmonary disease. Electronically Signed   By: Constance Holster M.D.   On: 08/31/2019 19:25    Procedures Procedures (including critical care time)  Medications Ordered in UC Medications - No data to display  Initial Impression / Assessment and Plan / UC Course  I have reviewed the triage vital signs and the nursing notes.  Pertinent labs & imaging results that were available during my care of the patient were reviewed by me and considered in my medical decision making (see chart for details).    34 year old female presents with cough.  Likely viral in origin.  Chest x-ray obtained today and independently reviewed by me.  Interpretation: No acute infiltrate.  Unremarkable chest x-ray.  Tessalon Perles for cough.  Supportive care.  Final Clinical Impressions(s) / UC Diagnoses   Final diagnoses:  Cough   Discharge Instructions   None    ED Prescriptions    Medication Sig Dispense Auth. Provider   benzonatate (TESSALON) 200 MG capsule Take 1 capsule (200 mg total) by mouth 3 (three) times daily as needed for cough. 30 capsule Coral Spikes, DO     PDMP not reviewed this encounter.   Coral Spikes, [place] 08/31/19 2010

## 2019-08-31 NOTE — ED Triage Notes (Signed)
Patient complains of cough x 1 week that has been constant.

## 2022-07-16 ENCOUNTER — Ambulatory Visit
Admission: EM | Admit: 2022-07-16 | Discharge: 2022-07-16 | Disposition: A | Discharge status: Home / Self Care | Payer: Medicaid Other | Attending: Family Medicine | Admitting: Family Medicine

## 2022-07-16 DIAGNOSIS — J020 Streptococcal pharyngitis: Secondary | ICD-10-CM | POA: Insufficient documentation

## 2022-07-16 LAB — GROUP A STREP BY PCR: Group A Strep by PCR: DETECTED — AB

## 2022-07-16 MED ORDER — AZITHROMYCIN 250 MG PO TABS: 250.0000 mg | ORAL_TABLET | Freq: Every day | ORAL | 0 refills | Status: AC

## 2022-07-16 NOTE — ED Triage Notes (Signed)
Pt c/o sore throat x1 day, states she was exposed to strep. Denies any fevers.

## 2022-07-16 NOTE — Discharge Instructions (Addendum)
Stop by the pharmacy to pick up your prescriptions.  Follow up with your primary care provider as needed.  

## 2022-07-16 NOTE — ED Provider Notes (Signed)
[facility]    CSN: [id number] Arrival date & time: 07/16/22  1029      History   Chief Complaint Chief Complaint  Patient presents with   Sore Throat    HPI Tara Nelson is a 37 y.o. female.   HPI   Tara Nelson presents for sore throat that started on Monday. Symptoms are getting worse. Pain pain with swallowing and pain on both sides of her neck.  She had a lot of discharge in her mouth this morning. Her son is being treated for strep. No known fever. Endorses headache and has decrease appetite due to swallowing pain.  She has no rash, abdominal pain, nausea, vomiting or diarrhea.  She did not take anything for her symptoms.     Past Medical History:  Diagnosis Date   Back pain    Ruptured lumbar disc    Thrombocytopenia (HCC)     There are no problems to display for this patient.   Past Surgical History:  Procedure Laterality Date   ABDOMINAL HYSTERECTOMY     ANTERIOR CRUCIATE LIGAMENT REPAIR     both ankles    OB History   No obstetric history on file.      Home Medications    Prior to Admission medications   Medication Sig Start Date End Date Taking? Authorizing Provider  azithromycin (ZITHROMAX Z-PAK) 250 MG tablet Take 1 tablet (250 mg total) by mouth daily. Take 2 tablets on day 1 07/16/22  Yes Gevork Ayyad, DO  busPIRone (BUSPAR) 15 MG tablet Take by mouth. 03/26/22 03/26/23 Yes [provider]  sertraline (ZOLOFT) 100 MG tablet Take by mouth. 04/23/22 04/23/23 Yes [provider]  traZODone (DESYREL) 50 MG tablet 1.5 po qhs prn insomnia 03/26/22  Yes [provider]  benzonatate (TESSALON) 200 MG capsule Take 1 capsule (200 mg total) by mouth 3 (three) times daily as needed for cough. 08/31/19   Tommie Sams, DO  cyclobenzaprine (FLEXERIL) 10 MG tablet Take 10 mg by mouth 2 (two) times daily.    [provider]  FLUoxetine (PROZAC) 10 MG capsule 1 po daily x 1 week, then 2 po daily 08/29/19   [provider]  gabapentin (NEURONTIN) 100 MG capsule Take 100 mg by mouth daily.    [provider]  oxycodone (OXY-IR) 5 MG capsule Take 5 mg by mouth 2 (two) times daily.    [provider]    Family History Family History  Problem Relation Age of Onset   Breast cancer Maternal Grandmother    Breast cancer Paternal Grandmother     Social History Social History   Tobacco Use   Smoking status: Every Day    Packs/day: .5    Types: Cigarettes   Smokeless tobacco: Never  Vaping Use   Vaping Use: Never used  Substance Use Topics   Alcohol use: Yes    Comment: occ wine   Drug use: No     Allergies   Penicillins, Sulfa antibiotics, and Toradol [ketorolac tromethamine]   Review of Systems Review of Systems: negative unless otherwise stated in HPI.      Physical Exam Triage Vital Signs ED Triage Vitals  Enc Vitals Group     BP 07/16/22 1141 121/84     Pulse Rate 07/16/22 1141 86     Resp 07/16/22 1141 16     Temp 07/16/22 1141 98.7 F (37.1 C)     Temp Source 07/16/22 1141 Oral  SpO2 07/16/22 1141 99 %     Weight 07/16/22 1140 217 lb (98.4 kg)     Height 07/16/22 1140 5\' 9"  (1.753 m)     Head Circumference --      Peak Flow --      Pain Score 07/16/22 1154 8     Pain Loc --      Pain Edu? --      Excl. in GC? --    No data found.  Updated Vital Signs BP 121/84 (BP Location: Left Arm)   Pulse 86   Temp 98.7 F (37.1 C) (Oral)   Resp 16   Ht 5\' 9"  (1.753 m)   Wt 98.4 kg   SpO2 99%   BMI 32.05 kg/m   Visual Acuity Right Eye Distance:   Left Eye Distance:   Bilateral Distance:    Right Eye Near:   Left Eye Near:    Bilateral Near:     Physical Exam GEN:     alert, non-toxic appearing female in no distress    HENT:  mucus membranes moist, oropharyngeal without lesions or exudate, mild oropharyngeal erythema, no nasal discharge EYES:   pupils equal and reactive, no scleral injection or discharge NECK:  normal ROM, bilateral  lymphadenopathy, no meningismus   RESP:  no increased work of breathing, clear to auscultation bilaterally CVS:   regular rate and rhythm Skin:   warm and dry, no rash on visible skin    UC Treatments / Results  Labs (all labs ordered are listed, but only abnormal results are displayed) Labs Reviewed  GROUP A STREP BY PCR - Abnormal; Notable for the following components:      Result Value   Group A Strep by PCR DETECTED (*)    All other components within normal limits    EKG   Radiology No results found.  Procedures Procedures (including critical care time)  Medications Ordered in UC Medications - No data to display  Initial Impression / Assessment and Plan / UC Course  I have reviewed the triage vital signs and the nursing notes.  Pertinent labs & imaging results that were available during my care of the patient were reviewed by me and considered in my medical decision making (see chart for details).       Pt is a 37 y.o. female who presents for sore throat and neck pain.   Tara Nelson is afebrile here. Satting well on room air. Overall pt is non-toxic appearing, well hydrated, without respiratory distress. Strep PCR is positive.  Treat with antibiotics as below.  Discussed symptomatic treatment.  Explained lack of efficacy of antibiotics in viral disease.  Typical duration of symptoms discussed.   Return and ED precautions given and voiced understanding. Discussed MDM, treatment plan and plan for follow-up with patient who agrees with plan.     Final Clinical Impressions(s) / UC Diagnoses   Final diagnoses:  Strep pharyngitis     Discharge Instructions      Stop by the pharmacy to pick up your prescriptions.  Follow up with your primary care provider as needed.      ED Prescriptions     Medication Sig Dispense Auth. Provider   azithromycin (ZITHROMAX Z-PAK) 250 MG tablet Take 1 tablet (250 mg total) by mouth daily. Take 2 tablets on day 1 6 tablet  Anira Senegal, DO      PDMP not reviewed this encounter.   Katha Cabal, DO 07/22/22 2255
